# Patient Record
Sex: Female | Born: 1970
Health system: Southern US, Community
[De-identification: ages and names within clinical notes are randomized; demographics above are authoritative.]

## PROBLEM LIST (undated history)

## (undated) HISTORY — PX: CHOLECYSTECTOMY: SHX55

## (undated) HISTORY — PX: OTHER SURGICAL HISTORY: SHX169

---

## 2016-05-22 ENCOUNTER — Emergency Department (HOSPITAL_COMMUNITY): Admission: EM | Admit: 2016-05-22 | Discharge: 2016-05-22 | Discharge status: Against Medical Advice | Payer: Self-pay

## 2016-05-22 ENCOUNTER — Encounter (HOSPITAL_COMMUNITY): Payer: Self-pay | Admitting: Emergency Medicine

## 2016-05-22 ENCOUNTER — Encounter (HOSPITAL_COMMUNITY): Payer: Self-pay

## 2016-05-22 ENCOUNTER — Ambulatory Visit (HOSPITAL_COMMUNITY)
Admission: RE | Admit: 2016-05-22 | Discharge: 2016-05-22 | Disposition: A | Discharge status: Home / Self Care | Payer: BLUE CROSS/BLUE SHIELD | Source: Ambulatory Visit | Attending: Family Medicine | Admitting: Family Medicine

## 2016-05-22 ENCOUNTER — Inpatient Hospital Stay (HOSPITAL_COMMUNITY)
Admission: EM | Admit: 2016-05-22 | Discharge: 2016-05-31 | DRG: 373 | Disposition: A | Discharge status: Home Health | Payer: BLUE CROSS/BLUE SHIELD | Attending: Surgery | Admitting: Surgery

## 2016-05-22 ENCOUNTER — Ambulatory Visit (INDEPENDENT_AMBULATORY_CARE_PROVIDER_SITE_OTHER): Payer: BLUE CROSS/BLUE SHIELD | Admitting: Family Medicine

## 2016-05-22 ENCOUNTER — Encounter: Payer: Self-pay | Admitting: Family Medicine

## 2016-05-22 VITALS — BP 119/76 | HR 90 | Temp 98.1°F | Resp 16 | Wt 155.0 lb

## 2016-05-22 DIAGNOSIS — R935 Abnormal findings on diagnostic imaging of other abdominal regions, including retroperitoneum: Secondary | ICD-10-CM

## 2016-05-22 DIAGNOSIS — N739 Female pelvic inflammatory disease, unspecified: Secondary | ICD-10-CM

## 2016-05-22 DIAGNOSIS — K353 Acute appendicitis with localized peritonitis, without perforation or gangrene: Secondary | ICD-10-CM

## 2016-05-22 DIAGNOSIS — R1031 Right lower quadrant pain: Secondary | ICD-10-CM | POA: Insufficient documentation

## 2016-05-22 DIAGNOSIS — K3532 Acute appendicitis with perforation and localized peritonitis, without abscess: Secondary | ICD-10-CM

## 2016-05-22 DIAGNOSIS — K3533 Acute appendicitis with perforation and localized peritonitis, with abscess: Secondary | ICD-10-CM | POA: Diagnosis present

## 2016-05-22 LAB — COMPREHENSIVE METABOLIC PANEL
ALBUMIN: 3.1 g/dL — AB (ref 3.5–5.0)
ALK PHOS: 113 U/L (ref 38–126)
ALT: 20 U/L (ref 14–54)
ANION GAP: 9 (ref 5–15)
AST: 21 U/L (ref 15–41)
BUN: 6 mg/dL (ref 6–20)
CO2: 25 mmol/L (ref 22–32)
Calcium: 8.7 mg/dL — ABNORMAL LOW (ref 8.9–10.3)
Chloride: 99 mmol/L — ABNORMAL LOW (ref 101–111)
Creatinine, Ser: 0.61 mg/dL (ref 0.44–1.00)
GFR calc Af Amer: >60 mL/min (ref >60)
GFR calc non Af Amer: >60 mL/min (ref >60)
GLUCOSE: 101 mg/dL — AB (ref 65–99)
POTASSIUM: 3.8 mmol/L (ref 3.5–5.1)
SODIUM: 133 mmol/L — AB (ref 135–145)
Total Bilirubin: 0.9 mg/dL (ref 0.3–1.2)
Total Protein: 7.8 g/dL (ref 6.5–8.1)

## 2016-05-22 LAB — POC MICROSCOPIC URINALYSIS (UMFC)

## 2016-05-22 LAB — POCT URINALYSIS DIP (MANUAL ENTRY)
BILIRUBIN UA: NEGATIVE mg/dL
Bilirubin, UA: NEGATIVE
Glucose, UA: NEGATIVE mg/dL
NITRITE UA: NEGATIVE
PH UA: 6.5 (ref 5.0–8.0)
Spec Grav, UA: 1.015 (ref 1.010–1.025)
Urobilinogen, UA: 1 E.U./dL

## 2016-05-22 LAB — POCT CBC
GRANULOCYTE PERCENT: 82 % — AB (ref 37–80)
HEMATOCRIT: 31.4 % — AB (ref 37.7–47.9)
Hemoglobin: 10.5 g/dL — AB (ref 12.2–16.2)
Lymph, poc: 2.8 (ref 0.6–3.4)
MCH: 28.3 pg (ref 27–31.2)
MCHC: 33.5 g/dL (ref 31.8–35.4)
MCV: 84.3 fL (ref 80–97)
MID (cbc): 0.3 (ref 0–0.9)
MPV: 7 fL (ref 0–99.8)
POC GRANULOCYTE: 13.9 — AB (ref 2–6.9)
POC LYMPH %: 16.2 % (ref 10–50)
POC MID %: 1.8 % (ref 0–12)
Platelet Count, POC: 348 10*3/uL (ref 142–424)
RBC: 3.73 M/uL — AB (ref 4.04–5.48)
RDW, POC: 14 %
WBC: 17 10*3/uL — AB (ref 4.6–10.2)

## 2016-05-22 LAB — POCT URINE PREGNANCY: Preg Test, Ur: NEGATIVE

## 2016-05-22 MED ORDER — HYDROCODONE-ACETAMINOPHEN 5-325 MG PO TABS
1.0000 | ORAL_TABLET | ORAL | Status: DC | PRN
  Administered 2016-05-23 – 2016-05-25 (×2): 1 via ORAL
  Filled 2016-05-22 (×3): qty 1

## 2016-05-22 MED ORDER — KCL IN DEXTROSE-NACL 20-5-0.45 MEQ/L-%-% IV SOLN
INTRAVENOUS | Status: DC
  Administered 2016-05-22 – 2016-05-23 (×2): via INTRAVENOUS
  Administered 2016-05-23: 1000 mL via INTRAVENOUS
  Administered 2016-05-24 (×2): via INTRAVENOUS
  Filled 2016-05-22 (×5): qty 1000

## 2016-05-22 MED ORDER — PIPERACILLIN-TAZOBACTAM 3.375 G IVPB
3.3750 g | Freq: Three times a day (TID) | INTRAVENOUS | Status: DC
  Administered 2016-05-22 – 2016-05-29 (×20): 3.375 g via INTRAVENOUS
  Filled 2016-05-22 (×19): qty 50

## 2016-05-22 MED ORDER — MORPHINE SULFATE (PF) 4 MG/ML IV SOLN
1.0000 mg | INTRAVENOUS | Status: DC | PRN
  Administered 2016-05-23 (×2): 1 mg via INTRAVENOUS
  Administered 2016-05-30: 2 mg via INTRAVENOUS
  Filled 2016-05-22 (×3): qty 1

## 2016-05-22 MED ORDER — IOPAMIDOL (ISOVUE-300) INJECTION 61%
100.0000 mL | Freq: Once | INTRAVENOUS | Status: AC | PRN
  Administered 2016-05-22: 100 mL via INTRAVENOUS

## 2016-05-22 MED ORDER — ONDANSETRON 4 MG PO TBDP: 4.0000 mg | ORAL_TABLET | Freq: Four times a day (QID) | ORAL | Status: DC | PRN

## 2016-05-22 MED ORDER — ONDANSETRON HCL 4 MG/2ML IJ SOLN
4.0000 mg | Freq: Four times a day (QID) | INTRAMUSCULAR | Status: DC | PRN
  Administered 2016-05-30: 4 mg via INTRAVENOUS
  Filled 2016-05-22: qty 2

## 2016-05-22 MED ORDER — IOPAMIDOL (ISOVUE-300) INJECTION 61%
INTRAVENOUS | Status: AC
  Administered 2016-05-22: 30 mL via ORAL
  Filled 2016-05-22: qty 30

## 2016-05-22 NOTE — ED Notes (Signed)
Attempted to call 5 Glenda Wilcox with no answer.  Will try again.

## 2016-05-22 NOTE — Assessment & Plan Note (Signed)
Her exam is concerning for appendicitis. Doubtful for obstruction.

## 2016-05-22 NOTE — Patient Instructions (Addendum)
Thank you for coming in,   We will obtain a CT scan of your abdomen and I will call you with the results and what we do next.     Please feel free to call with any questions or concerns at any time, at [phone]. --Dr. Susa GriffinsSchmitz  Go [facility] now for ct  IF you received an x-ray today, you will receive an invoice from [facility]. Please contact [facility] at [phone] with questions or concerns regarding your invoice.   IF you received labwork today, you will receive an invoice from [facility]. Please contact [facility] at [phone] with questions or concerns regarding your invoice.   Our billing staff will not be able to assist you with questions regarding bills from these companies.  You will be contacted with the lab results as soon as they are available. The fastest way to get your results is to activate your My Chart account. Instructions are located on the last page of this paperwork. If you have not heard from us regarding the results in 2 weeks, please contact this office.

## 2016-05-22 NOTE — H&P (Signed)
Re:   Ernestina ColumbiaZeinab Surita DOB:   06/26/1970 MRN:   [id number]  Chief Complaint Abdominal pain  ASSESEMENT AND PLAN: 1.  Abscess, RLQ, probably secondary to ruptured appendicitis  Plan:  NPO, IVF, antibiotics, and per drain in AM  I explained all the findings and options with the patient and her friends.  No chief complaint on file.  PHYSICIAN REQUESTING CONSULTATION: Patient, No Pcp Per  HISTORY OF PRESENT ILLNESS: Ernestina ColumbiaZeinab Hopkins is a 46 y.o. (DOB: 11/13/1970)  AA female whose primary care physician is Patient, No Pcp Per. Her roommates, Nahid Haseid and Smithfield Foodsreeg Garassi, are with her.  I was called by Dr. Herma CarsonJ. Greene for Ms. Camila LiOsman who has had abdominal symptoms for 10+ days.  She [facility] to [facility] today for worsening abdominal pain.  She has had some diarrhea with these symptoms.  She has had some vomiting, maybe some fever, but she has not taken her temperature.  She has no chronic GI complaints and is no meds.  She had a myomectomy in 2004 in [place] (throught a lower abdominal Pfannenstiel incision), she had an "endoscopy" of her uterus in 2008, and "bile stones" removed in 2016 in [place]. Her gall bladder is absent on CT scan.  Her CT scan - 05/22/2016 - Marked inflammatory process in the right lower quadrant which I suspect is related to appendicitis with rupture and 9 x 6 x 6.5 cm abscess displacing adjacent terminal ileum and cecum (which are secondarily irritated). Inflammatory process immediately adjacent to right iliac vessels without significant compression or thrombosis.  WBC - 17,000 - 05/22/2016   No past medical history on file.   No past surgical history on file.    No current facility-administered medications for this encounter.    Current Outpatient Prescriptions  Medication Sig Dispense Refill  . ibuprofen (ADVIL,MOTRIN) 200 MG tablet Take 200 mg by mouth every 6 (six) hours as needed.       No Known Allergies  REVIEW OF SYSTEMS: Skin:  No history of rash.   No history of abnormal moles. Infection:  No history of hepatitis or HIV.  No history of MRSA. Neurologic:  No history of stroke.  No history of seizure.  No history of headaches. Cardiac:  No history of hypertension. No history of heart disease.   Pulmonary:  Does not smoke cigarettes.  No asthma or bronchitis.  No OSA/CPAP.  Endocrine:  No diabetes. No thyroid disease. Gastrointestinal:  See HPI. Urologic:  No history of kidney stones.  No history of bladder infections. Musculoskeletal:  No history of joint or back disease. Hematologic:  No bleeding disorder.  No history of anemia.  Not anticoagulated. Psycho-social:  The patient is oriented.   The patient has no obvious psychologic or social impairment to understanding our conversation and plan.  SOCIAL and FAMILY HISTORY: Married, but husband lives in [place]. She lives with "roommates" - Nahid Haseid and Engineer, building servicesAreeg Garassi. 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 She has no children.  PHYSICAL EXAM: There were no vitals taken for this visit.  General: Thin older looking AA F who is alert. Skin:  Inspection and palpation - no mass or rash. Eyes:  Conjunctiva and lids unremarkable.            Pupils are equal Ears, Nose, Mouth, and Throat:  Ears and nose unremarkable            Lips and teeth are unremarable. Neck: Supple. No  mass, trachea midline.  No thyroid mass. Lymph Nodes:  No supraclavicular, cervical, or inguinal nodes. Lungs: Normal respiratory effort.  Clear to auscultation and symmetric breath sounds. Heart:  Palpation of the heart is normal.            Auscultation: RRR. No murmur or rub.             Abdomen: Soft. No hernia.           Mass effect in RLQ.  Pfannenstiel incision, laparoscopic scars at umbilicus   Rectal: Not done. Musculoskeletal:              Good muscle strength and ROM  in upper and lower extremities. Neurologic:  Grossly intact to motor and sensory  function. Psychiatric: Normal judgement and insight. Behavior is normal.            Oriented to time, person, place.   DATA REVIEWED, COUNSELING AND COORDINATION OF CARE: Epic notes reviewed. Counseling and coordination of care exceeded more than 50% of the time spent with patient. Total time spent with patient and charting: 50 minutes.  Ovidio Kin, MD,  [facility] [address]  [address]   [place], [place]    [address] Phone:  [phone] FAX:  [phone]

## 2016-05-22 NOTE — ED Provider Notes (Signed)
[facility]-EMERGENCY DEPT Provider Note   CSN: [id number] Arrival date & time: 05/22/16  1911     History   Chief Complaint Chief Complaint  Patient presents with  . appendicitis    HPI Glenda Wilcox is a 46 y.o. female.  PT is a 46yo female who presents with a ten-day history of worsening pain to her right lower abdomen. She's had some associated fevers and nausea vomiting. She was seen by her PCP who ordered a CT scan of abdomen and pelvis which shows appendicitis with likely perforation and abscess collection. She was sent here for further evaluation.      History reviewed. No pertinent past medical history.  Patient Active Problem List   Diagnosis Date Noted  . Right lower quadrant abdominal pain 05/22/2016  . Abscess, appendix 05/22/2016    Past Surgical History:  Procedure Laterality Date  . gallstone removal      OB History    No data available       Home Medications    Prior to Admission medications   Medication Sig Start Date End Date Taking? Authorizing Provider  ibuprofen (ADVIL,MOTRIN) 200 MG tablet Take 200 mg by mouth every 6 (six) hours as needed.   Yes [provider]    Family History No family history on file.  Social History Social History  Substance Use Topics  . Smoking status: Never Smoker  . Smokeless tobacco: Never Used  . Alcohol use No     Allergies   Patient has no known allergies.   Review of Systems Review of Systems  Constitutional: Positive for fever. Negative for chills, diaphoresis and fatigue.  HENT: Negative for congestion, rhinorrhea and sneezing.   Eyes: Negative.   Respiratory: Negative for cough, chest tightness and shortness of breath.   Cardiovascular: Negative for chest pain and leg swelling.  Gastrointestinal: Positive for abdominal pain, nausea and vomiting. Negative for blood in stool and diarrhea.  Genitourinary: Negative for difficulty urinating, flank pain, frequency and hematuria.    Musculoskeletal: Negative for arthralgias and back pain.  Skin: Negative for rash.  Neurological: Negative for dizziness, speech difficulty, weakness, numbness and headaches.     Physical Exam Updated Vital Signs BP 128/77 (BP Location: Left Arm)   Pulse 96   Temp 99.8 F (37.7 C) (Oral)   Resp 16   SpO2 96%   Physical Exam  Constitutional: She is oriented to person, place, and time. She appears well-developed and well-nourished.  HENT:  Head: Normocephalic and atraumatic.  Eyes: Pupils are equal, round, and reactive to light.  Neck: Normal range of motion. Neck supple.  Cardiovascular: Normal rate, regular rhythm and normal heart sounds.   Pulmonary/Chest: Effort normal and breath sounds normal. No respiratory distress. She has no wheezes. She has no rales. She exhibits no tenderness.  Abdominal: Soft. Bowel sounds are normal. There is tenderness (Moderate tenderness to right lower abdomen). There is no rebound and no guarding.  Musculoskeletal: Normal range of motion. She exhibits no edema.  Lymphadenopathy:    She has no cervical adenopathy.  Neurological: She is alert and oriented to person, place, and time.  Skin: Skin is warm and dry. No rash noted.  Psychiatric: She has a normal mood and affect.     ED Treatments / Results  Labs (all labs ordered are listed, but only abnormal results are displayed) Labs Reviewed  HIV ANTIBODY (ROUTINE TESTING)  COMPREHENSIVE METABOLIC PANEL    EKG  EKG Interpretation None  Radiology Ct Abdomen Pelvis W Contrast  Result Date: 05/22/2016 CLINICAL DATA:  46 year old female with right lower quadrant pain, nausea, vomiting and diarrhea for 3 weeks. Prior gallbladder surgery. Initial encounter. EXAM: CT ABDOMEN AND PELVIS WITH CONTRAST TECHNIQUE: Multidetector CT imaging of the abdomen and pelvis was performed using the standard protocol following bolus administration of intravenous contrast. CONTRAST:  ISOVUE-300  IOPAMIDOL (ISOVUE-300) INJECTION 61% COMPARISON:  None. FINDINGS: Lower chest: Minimal atelectasis. Heart size within normal limits. Prominent dense breast parenchyma with coarse calcifications. Hepatobiliary: No worrisome hepatic lesion. Very mild fatty infiltration. Minimally lobular contour without other findings of cirrhosis. Post cholecystectomy. Pancreas: No mass or inflammation. Spleen: No mass or enlargement. Adrenals/Urinary Tract: No renal or ureteral obstructing stone or hydronephrosis. Tiny low-density structure right kidney may represent a tiny angiomyelolipoma. No adrenal mass. Decompressed urinary bladder without gross abnormality. Stomach/Bowel: Marked inflammatory process in the right lower quadrant which I suspect is related to appendicitis with rupture and 9 x 6 x 6.5 cm abscess displacing adjacent terminal ileum and cecum (which are secondarily irritated). Other causes such as Meckel's diverticulum or tumor felt to be much less likely. Vascular/Lymphatic: No abdominal aortic aneurysm or large vessel occlusion. Inflammatory process immediately adjacent to right iliac vessels without significant compression or thrombosis. Lymph nodes within small bowel mesentery probably reactive in origin. Reproductive: No worrisome primary adnexal or uterine abnormality. Other: Gas within abscess but without surrounding free intraperitoneal air otherwise noted. Musculoskeletal: No worrisome osseous abnormality. IMPRESSION: Marked inflammatory process in the right lower quadrant which I suspect is related to appendicitis with rupture and 9 x 6 x 6.5 cm abscess displacing adjacent terminal ileum and cecum (which are secondarily irritated). Inflammatory process immediately adjacent to right iliac vessels without significant compression or thrombosis. These results were called by telephone at the time of interpretation on 05/22/2016 at 6:36 pm to Dr. Meredith Staggers , who verbally acknowledged these results.  Electronically Signed   By: Lacy Duverney M.D.   On: 05/22/2016 18:59    Procedures Procedures (including critical care time)  Medications Ordered in ED Medications  dextrose 5 % and 0.45 % NaCl with KCl 20 mEq/L infusion (not administered)  piperacillin-tazobactam (ZOSYN) IVPB 3.375 g (not administered)  HYDROcodone-acetaminophen (NORCO/VICODIN) 5-325 MG per tablet 1-2 tablet (not administered)  morphine 4 MG/ML injection 1-3 mg (not administered)  ondansetron (ZOFRAN-ODT) disintegrating tablet 4 mg (not administered)    Or  ondansetron (ZOFRAN) injection 4 mg (not administered)     Initial Impression / Assessment and Plan / ED Course  I have reviewed the triage vital signs and the nursing notes.  Pertinent labs & imaging results that were available during my care of the patient were reviewed by me and considered in my medical decision making (see chart for details).     Patient seen by Dr. Ezzard Standing who also started her on antibiotics. She will be admitted to the surgery service for appendectomy.  Final Clinical Impressions(s) / ED Diagnoses   Final diagnoses:  Acute appendicitis with localized peritonitis    New Prescriptions New Prescriptions   No medications on file     Rolan Bucco, MD 05/22/16 2011

## 2016-05-22 NOTE — ED Notes (Addendum)
Pt was sent here from her PCP for imaging of her abdomen for nausea, vomiting, and diarrhea x 10 days. Pt states symptoms got worse 2 days ago. Imaging showed appendicitis for which pt was sent back to ED. Surgeon is at bedside

## 2016-05-22 NOTE — ED Notes (Signed)
PAIN  IS IN RIGHT SIDE

## 2016-05-22 NOTE — Progress Notes (Signed)
7:04 PM   Patient discussed with Dr. Jordan LikesSchmitz this morning.  Agree with findings, assessment and plan of care per his note.    Call received from radiology - suspected ruptured appendicitis with abscess. Results d/w patient on phone and advised CT tech to walk her over to ER ([facility]).  Charge nurse advised at [facility] and advised surgeon [facility] - discussed with Dr. Ezzard StandingNewman.

## 2016-05-22 NOTE — ED Notes (Signed)
Urine in triage.

## 2016-05-22 NOTE — Progress Notes (Signed)
  Glenda ColumbiaZeinab Wilcox - 46 y.o. female MRN [id number]  Date of birth: 03/27/1970  SUBJECTIVE:  Including CC & ROS.  Chief Complaint  Patient presents with  . Abdominal Pain    x 10 days nausea had diarrhea and vomiting    Glenda Wilcox is presenting with RLQ abdominal pain. The pain has been present for 10 days. She has felt warm. Initially had pain with vomiting. She also had diarrhea with the first day of symptoms. The vomiting stopped after the first day. Doesn't remember eating different before her symptoms started. Pain was initially intermittent but has become more constant. There was no blood in the emesis or diarrhea. Has had diarrhea off and on since the symptoms started. She has nausea with anything she eats. No new medicines and no new travel. No chance of being pregnant. Reports last time having sexual intercourse was 4-5 months ago. No new rashes.    ROS: No unexpected weight loss, fever, chills, swelling, instability, muscle pain, numbness/tingling, redness, otherwise see HPI   HISTORY: Past Medical, Surgical, Social, and Family History Reviewed & Updated per EMR.   Pertinent Historical Findings include: PMHx: choledocholithiasis Surgical:  ERCP   Social:  No tobacco or alcohol use  FHx: dm2, HTN  DATA REVIEWED: none  PHYSICAL EXAM:  VS: BP 119/76 (Cuff Size: Normal)   Pulse 90   Temp 98.1 F (36.7 C) (Oral)   Resp 16   Wt 155 lb (70.3 kg)   SpO2 100%  PHYSICAL EXAM: Gen: NAD, alert, cooperative with exam,  HEENT: NCAT, EOMI, clear conjunctiva,  CV: RRR, good S1/S2, no murmur, no edema, capillary refill brisk  Resp: CTABL, no wheezes, non-labored Abd: TTP in RLQ, some guarding, +BS, positive McBurney's point,  Skin: no rashes, normal turgor  Neuro: no gross deficits.  Psych: alert and oriented   ASSESSMENT & PLAN:   No problem-specific Assessment & Plan notes found for this encounter.

## 2016-05-22 NOTE — ED Notes (Signed)
Bed: [id number] Expected date:  Expected time:  Means of arrival:  Comments: Triage, ruptured appendix, OSMOND

## 2016-05-23 ENCOUNTER — Inpatient Hospital Stay (HOSPITAL_COMMUNITY): Payer: BLUE CROSS/BLUE SHIELD

## 2016-05-23 LAB — CBC
HCT: 31.4 % — ABNORMAL LOW (ref 36.0–46.0)
Hemoglobin: 10.3 g/dL — ABNORMAL LOW (ref 12.0–15.0)
MCH: 28.3 pg (ref 26.0–34.0)
MCHC: 32.8 g/dL (ref 30.0–36.0)
MCV: 86.3 fL (ref 78.0–100.0)
PLATELETS: 349 10*3/uL (ref 150–400)
RBC: 3.64 MIL/uL — ABNORMAL LOW (ref 3.87–5.11)
RDW: 13.8 % (ref 11.5–15.5)
WBC: 15.8 10*3/uL — AB (ref 4.0–10.5)

## 2016-05-23 LAB — COMPREHENSIVE METABOLIC PANEL
ALT: 18 U/L (ref 14–54)
AST: 18 U/L (ref 15–41)
Albumin: 2.9 g/dL — ABNORMAL LOW (ref 3.5–5.0)
Alkaline Phosphatase: 109 U/L (ref 38–126)
Anion gap: 10 (ref 5–15)
BUN: 6 mg/dL (ref 6–20)
CALCIUM: 8.4 mg/dL — AB (ref 8.9–10.3)
CHLORIDE: 99 mmol/L — AB (ref 101–111)
CO2: 25 mmol/L (ref 22–32)
CREATININE: 0.64 mg/dL (ref 0.44–1.00)
Glucose, Bld: 120 mg/dL — ABNORMAL HIGH (ref 65–99)
POTASSIUM: 3.7 mmol/L (ref 3.5–5.1)
SODIUM: 134 mmol/L — AB (ref 135–145)
TOTAL PROTEIN: 7.5 g/dL (ref 6.5–8.1)
Total Bilirubin: 1.2 mg/dL (ref 0.3–1.2)

## 2016-05-23 LAB — HIV ANTIBODY (ROUTINE TESTING W REFLEX): HIV SCREEN 4TH GENERATION: NONREACTIVE

## 2016-05-23 LAB — PROTIME-INR
INR: 1.31
PROTHROMBIN TIME: 16.4 s — AB (ref 11.4–15.2)

## 2016-05-23 MED ORDER — FENTANYL CITRATE (PF) 100 MCG/2ML IJ SOLN
INTRAMUSCULAR | Status: AC | PRN
  Administered 2016-05-23 (×2): 25 ug via INTRAVENOUS

## 2016-05-23 MED ORDER — MIDAZOLAM HCL 2 MG/2ML IJ SOLN
INTRAMUSCULAR | Status: AC
  Filled 2016-05-23: qty 6

## 2016-05-23 MED ORDER — MIDAZOLAM HCL 2 MG/2ML IJ SOLN
INTRAMUSCULAR | Status: AC | PRN
  Administered 2016-05-23 (×3): 1 mg via INTRAVENOUS

## 2016-05-23 MED ORDER — LIDOCAINE HCL (PF) 1 % IJ SOLN
INTRAMUSCULAR | Status: AC | PRN
  Administered 2016-05-23: 10 mL via INTRADERMAL

## 2016-05-23 MED ORDER — FENTANYL CITRATE (PF) 100 MCG/2ML IJ SOLN
INTRAMUSCULAR | Status: AC
  Filled 2016-05-23: qty 4

## 2016-05-23 MED ORDER — ACETAMINOPHEN 325 MG PO TABS
650.0000 mg | ORAL_TABLET | Freq: Four times a day (QID) | ORAL | Status: DC | PRN
  Administered 2016-05-23 – 2016-05-25 (×3): 650 mg via ORAL
  Filled 2016-05-23 (×3): qty 2

## 2016-05-23 NOTE — Progress Notes (Signed)
Got a phone order from Dr. Johna SheriffHoxworth to change diet to NPO with sips of meds and Tylenol 650 q6 PRN with Temperature > 101 .

## 2016-05-23 NOTE — Consult Note (Signed)
Chief Complaint: Patient was seen in consultation today for CT-guided drainage of right lower quadrant/pelvic abscess Chief Complaint  Patient presents with  . appendicitis    Referring Physician(s): Newman,D  Supervising Physician: Oley BalmHassell, Daniel  [facility]  History of Present Illness: Glenda Wilcox is a 46 y.o. female recently admitted with history of right lower quadrant abdominal pain, diarrhea, nausea, vomiting and low-grade fever. Laboratory studies showed leukocytosis and CT abdomen/ pelvis revealed:   Marked inflammatory process in the right lower quadrant which   is likely related to appendicitis with rupture and 9 x 6 x 6.5 cm abscess displacing adjacent terminal ileum and cecum (which are secondarily irritated). Inflammatory process immediately adjacent to right iliac vessels without significant compression or thrombosis.  Request now received from surgical service for CT-guided drainage of the right lower quadrant abd/pelvic abscess.  History reviewed. No pertinent past medical history.  Past Surgical History:  Procedure Laterality Date  . gallstone removal      Allergies: Patient has no known allergies.  Medications: Prior to Admission medications   Medication Sig Start Date End Date Taking? Authorizing Provider  ibuprofen (ADVIL,MOTRIN) 200 MG tablet Take 200 mg by mouth every 6 (six) hours as needed.   Yes [provider]     No family history on file.  Social History   Social History  . Marital status: Married    Spouse name: N/A  . Number of children: N/A  . Years of education: N/A   Social History Main Topics  . Smoking status: Never Smoker  . Smokeless tobacco: Never Used  . Alcohol use No  . Drug use: Unknown  . Sexual activity: Not Asked   Other Topics Concern  . None   Social History Narrative  . None      Review of Systems see above; currently denies headache, chest pain, dyspnea, cough, back  pain, or abnormal bleeding.  Vital Signs: BP (!) 107/58 (BP Location: Left Arm)   Pulse 95   Temp 100.3 F (37.9 C) (Oral)   Resp 16   Ht 5\' 4"  (1.626 m)   Wt 148 lb 9.4 oz (67.4 kg)   SpO2 100%   BMI 25.51 kg/m   Physical Exam awake, alert. Chest clear to auscultation bilaterally. Heart with regular rate and rhythm. Abdomen soft, positive bowel sounds, mod tender right lower quadrant to palpation; lower extremities with no edema.  Mallampati Score:     Imaging: Ct Abdomen Pelvis W Contrast  Result Date: 05/22/2016 CLINICAL DATA:  46 year old female with right lower quadrant pain, nausea, vomiting and diarrhea for 3 weeks. Prior gallbladder surgery. Initial encounter. EXAM: CT ABDOMEN AND PELVIS WITH CONTRAST TECHNIQUE: Multidetector CT imaging of the abdomen and pelvis was performed using the standard protocol following bolus administration of intravenous contrast. CONTRAST:  100mL ISOVUE-300 IOPAMIDOL (ISOVUE-300) INJECTION 61% COMPARISON:  None. FINDINGS: Lower chest: Minimal atelectasis. Heart size within normal limits. Prominent dense breast parenchyma with coarse calcifications. Hepatobiliary: No worrisome hepatic lesion. Very mild fatty infiltration. Minimally lobular contour without other findings of cirrhosis. Post cholecystectomy. Pancreas: No mass or inflammation. Spleen: No mass or enlargement. Adrenals/Urinary Tract: No renal or ureteral obstructing stone or hydronephrosis. Tiny low-density structure right kidney may represent a tiny angiomyelolipoma. No adrenal mass. Decompressed urinary bladder without gross abnormality. Stomach/Bowel: Marked inflammatory process in the right lower quadrant which I suspect is related to appendicitis with rupture and 9 x 6 x 6.5 cm abscess displacing adjacent terminal ileum  and cecum (which are secondarily irritated). Other causes such as Meckel's diverticulum or tumor felt to be much less likely. Vascular/Lymphatic: No abdominal aortic aneurysm  or large vessel occlusion. Inflammatory process immediately adjacent to right iliac vessels without significant compression or thrombosis. Lymph nodes within small bowel mesentery probably reactive in origin. Reproductive: No worrisome primary adnexal or uterine abnormality. Other: Gas within abscess but without surrounding free intraperitoneal air otherwise noted. Musculoskeletal: No worrisome osseous abnormality. IMPRESSION: Marked inflammatory process in the right lower quadrant which I suspect is related to appendicitis with rupture and 9 x 6 x 6.5 cm abscess displacing adjacent terminal ileum and cecum (which are secondarily irritated). Inflammatory process immediately adjacent to right iliac vessels without significant compression or thrombosis. These results were called by telephone at the time of interpretation on 05/22/2016 at 6:36 pm to Dr. Meredith Staggers , who verbally acknowledged these results. Electronically Signed   By: Lacy Duverney M.D.   On: 05/22/2016 18:59    Labs:  CBC:  Recent Labs  05/22/16 1139 05/23/16 0449  WBC 17.0* 15.8*  HGB 10.5* 10.3*  HCT 31.4* 31.4*  PLT  --  349    COAGS: No results for input(s): INR, APTT in the last 8760 hours.  BMP:  Recent Labs  05/22/16 1956 05/23/16 0449  NA 133* 134*  K 3.8 3.7  CL 99* 99*  CO2 25 25  GLUCOSE 101* 120*  BUN 6 6  CALCIUM 8.7* 8.4*  CREATININE 0.61 0.64  GFRNONAA >60 >60  GFRAA >60 >60    LIVER FUNCTION TESTS:  Recent Labs  05/22/16 1956 05/23/16 0449  BILITOT 0.9 1.2  AST 21 18  ALT 20 18  ALKPHOS 113 109  PROT 7.8 7.5  ALBUMIN 3.1* 2.9*    TUMOR MARKERS: No results for input(s): AFPTM, CEA, CA199, CHROMGRNA in the last 8760 hours.  Assessment and Plan: Patient with history of right lower quadrant abdominal pain, nausea, vomiting, diarrhea, low-grade fever , leukocytosis and CT revealing appendiceal abscess. Request now received for CT guided drainage of the abscess. Imaging studies have  been reviewed by Dr. Deanne Coffer and the area appears amenable to drainage.Risks and benefits discussed with the patient including bleeding, infection, damage to adjacent structures, bowel perforation/fistula connection, and sepsis.All of the patient's questions were answered, patient is agreeable to proceed.Consent signed and in chart. Procedure scheduled for later today.     Thank you for this interesting consult.  I greatly enjoyed meeting Glenda Wilcox and look forward to participating in their care.  A copy of this report was sent to the requesting provider on this date.  Electronically Signed: D. Lysle Morales 05/23/2016, 10:13 AM   I spent a total of 25 minutes in face to face in clinical consultation, greater than 50% of which was counseling/coordinating care for CT-guided drainage of right lower quadrant abdominal/pelvic abscess

## 2016-05-23 NOTE — Progress Notes (Signed)
Pt showing signs of sepsis with a temperature of 102.2, HR of 102 and respirations of 22. Called rapid response. Rapid response nurse assessed patient and determined patient was not septic, but a response to the drain being placed. Will continue to monitor and will call rapid response if needed.

## 2016-05-23 NOTE — Significant Event (Signed)
Rapid Response Event Note  Overview: Called to room 1528 for observation of patient looking septic.       Initial Focused Assessment: Focused on VS and labs.   Interventions: Cont to observe Plan of Care (if not transferred):Continue to monitor, call RRT RN if needed further.  Event Summary: Upon arrival patient supine in the bed. VS 133/80(95), HR 100, NSR/ST, RR 18, Sats 100% on [place] 2 L, last temp 102.2. Respirations regular and unlabored, auscultated breath sounds were clear, abdomen tender to touch with hypo bowel sounds, drain on right abdomen with purulent drainage. Will hand off to night RRT RN and cont to observe. Possibly needs lactic acid drawn. Reassured bedside RN to call RRT RN if needed further.   at      at          [place]

## 2016-05-23 NOTE — Progress Notes (Signed)
Patient ID: Glenda Wilcox, female   DOB: 03/04/1970, 46 y.o.   MRN: [id number]  [facility] Surgery Progress Note     Subjective: CC- abdominal abscess, abdominal pain Patient states that she feels slightly better this morning. Pain medication is helping. She does still complain of RLQ pain. Denies any current n/v. BM this AM.  Objective: Vital signs in last 24 hours: Temp:  [98.1 F (36.7 C)-101.3 F (38.5 C)] 100.3 F (37.9 C) (05/15 0447) Pulse Rate:  [87-96] 95 (05/15 0447) Resp:  [16-17] 16 (05/15 0447) BP: (107-129)/(58-83) 107/58 (05/15 0447) SpO2:  [96 %-100 %] 100 % (05/15 0447) Weight:  [148 lb (67.1 kg)-155 lb (70.3 kg)] 148 lb 9.4 oz (67.4 kg) (05/14 2114) Last BM Date: 05/21/16  Intake/Output from previous day: 05/14 0701 - 05/15 0700 In: 1475 [I.V.:1375; IV Piggyback:100] Out: 700 [Urine:700] Intake/Output this shift: No intake/output data recorded.  PE: Gen:  Alert, NAD, pleasant Card:  RRR, no M/G/R heard Pulm:  CTAB, no W/R/R, effort normal Abd: Soft, ND, moderate RLQ tendernes, +BS, no HSM, no hernia Ext:  No erythema, edema, or tenderness BUE/BLE   Lab Results:   Recent Labs  05/22/16 1139 05/23/16 0449  WBC 17.0* 15.8*  HGB 10.5* 10.3*  HCT 31.4* 31.4*  PLT  --  349   BMET  Recent Labs  05/22/16 1956 05/23/16 0449  NA 133* 134*  K 3.8 3.7  CL 99* 99*  CO2 25 25  GLUCOSE 101* 120*  BUN 6 6  CREATININE 0.61 0.64  CALCIUM 8.7* 8.4*   PT/INR No results for input(s): LABPROT, INR in the last 72 hours. CMP     Component Value Date/Time   NA 134 (L) 05/23/2016 0449   K 3.7 05/23/2016 0449   CL 99 (L) 05/23/2016 0449   CO2 25 05/23/2016 0449   GLUCOSE 120 (H) 05/23/2016 0449   BUN 6 05/23/2016 0449   CREATININE 0.64 05/23/2016 0449   CALCIUM 8.4 (L) 05/23/2016 0449   PROT 7.5 05/23/2016 0449   ALBUMIN 2.9 (L) 05/23/2016 0449   AST 18 05/23/2016 0449   ALT 18 05/23/2016 0449   ALKPHOS 109 05/23/2016 0449   BILITOT 1.2  05/23/2016 0449   GFRNONAA >60 05/23/2016 0449   GFRAA >60 05/23/2016 0449   Lipase  No results found for: LIPASE     Studies/Results: Ct Abdomen Pelvis W Contrast  Result Date: 05/22/2016 CLINICAL DATA:  46 year old female with right lower quadrant pain, nausea, vomiting and diarrhea for 3 weeks. Prior gallbladder surgery. Initial encounter. EXAM: CT ABDOMEN AND PELVIS WITH CONTRAST TECHNIQUE: Multidetector CT imaging of the abdomen and pelvis was performed using the standard protocol following bolus administration of intravenous contrast. CONTRAST:  ISOVUE-300 IOPAMIDOL (ISOVUE-300) INJECTION 61% COMPARISON:  None. FINDINGS: Lower chest: Minimal atelectasis. Heart size within normal limits. Prominent dense breast parenchyma with coarse calcifications. Hepatobiliary: No worrisome hepatic lesion. Very mild fatty infiltration. Minimally lobular contour without other findings of cirrhosis. Post cholecystectomy. Pancreas: No mass or inflammation. Spleen: No mass or enlargement. Adrenals/Urinary Tract: No renal or ureteral obstructing stone or hydronephrosis. Tiny low-density structure right kidney may represent a tiny angiomyelolipoma. No adrenal mass. Decompressed urinary bladder without gross abnormality. Stomach/Bowel: Marked inflammatory process in the right lower quadrant which I suspect is related to appendicitis with rupture and 9 x 6 x 6.5 cm abscess displacing adjacent terminal ileum and cecum (which are secondarily irritated). Other causes such as Meckel's diverticulum or tumor felt to be much less likely.  Vascular/Lymphatic: No abdominal aortic aneurysm or large vessel occlusion. Inflammatory process immediately adjacent to right iliac vessels without significant compression or thrombosis. Lymph nodes within small bowel mesentery probably reactive in origin. Reproductive: No worrisome primary adnexal or uterine abnormality. Other: Gas within abscess but without surrounding free  intraperitoneal air otherwise noted. Musculoskeletal: No worrisome osseous abnormality. IMPRESSION: Marked inflammatory process in the right lower quadrant which I suspect is related to appendicitis with rupture and 9 x 6 x 6.5 cm abscess displacing adjacent terminal ileum and cecum (which are secondarily irritated). Inflammatory process immediately adjacent to right iliac vessels without significant compression or thrombosis. These results were called by telephone at the time of interpretation on 05/22/2016 at 6:36 pm to Dr. Meredith StaggersJEFFREY GREENE , who verbally acknowledged these results. Electronically Signed   By: Lacy DuverneySteven  Olson M.D.   On: 05/22/2016 18:59    Anti-infectives: Anti-infectives    Start     Dose/Rate Route Frequency Ordered Stop   05/22/16 2000  piperacillin-tazobactam (ZOSYN) IVPB 3.375 g     3.375 g 12.5 mL/hr over 240 Minutes Intravenous Every 8 hours 05/22/16 1949         Assessment/Plan Abscess, RLQ, probably secondary to ruptured appendicitis - CT scan shows marked inflammatory process in the right lower quadrant likely related to appendicitis with rupture and 9 x 6 x 6.5 cm abscess  - WBC slightly down today 15.8 from 17  ID - zosyn 5/14>> FEN - IVF, NPO VTE - SCDs, hold chemical DVT prophylaxis for possible procedure  Plan - IR consult pending for possible percutaneous drainage of abscess (spoke with Caryn BeeKevin PA and Dr. Deanne CofferHassell is reviewing case). Continue IV antibiotics. NPO for possibly procedure; ok for ice chips/sips of water after procedure.   LOS: 1 day    Edson SnowballBROOKE A MILLER , [facility] 05/23/2016, 9:15 AM Pager: [phone] Consults: [phone] Mon-Fri 7:00 am-4:30 pm Sat-Sun 7:00 am-11:30 am

## 2016-05-23 NOTE — Procedures (Signed)
CT RLQ abscess drain 26F 30ml purulent aspirate sent for GS &cx No complication No blood loss. See complete dictation in [facility].

## 2016-05-24 ENCOUNTER — Encounter (HOSPITAL_COMMUNITY): Payer: Self-pay | Admitting: Surgery

## 2016-05-24 LAB — BASIC METABOLIC PANEL
Anion gap: 8 (ref 5–15)
CO2: 24 mmol/L (ref 22–32)
Calcium: 8.3 mg/dL — ABNORMAL LOW (ref 8.9–10.3)
Chloride: 100 mmol/L — ABNORMAL LOW (ref 101–111)
Creatinine, Ser: 0.8 mg/dL (ref 0.44–1.00)
GFR calc Af Amer: >60 mL/min (ref >60)
GLUCOSE: 160 mg/dL — AB (ref 65–99)
POTASSIUM: 4.1 mmol/L (ref 3.5–5.1)
Sodium: 132 mmol/L — ABNORMAL LOW (ref 135–145)

## 2016-05-24 LAB — CBC
HEMATOCRIT: 30.7 % — AB (ref 36.0–46.0)
Hemoglobin: 10 g/dL — ABNORMAL LOW (ref 12.0–15.0)
MCH: 28.3 pg (ref 26.0–34.0)
MCHC: 32.6 g/dL (ref 30.0–36.0)
MCV: 87 fL (ref 78.0–100.0)
PLATELETS: 331 10*3/uL (ref 150–400)
RBC: 3.53 MIL/uL — AB (ref 3.87–5.11)
RDW: 14 % (ref 11.5–15.5)
WBC: 19.2 10*3/uL — ABNORMAL HIGH (ref 4.0–10.5)

## 2016-05-24 MED ORDER — POTASSIUM CHLORIDE IN NACL 20-0.9 MEQ/L-% IV SOLN
INTRAVENOUS | Status: DC
  Administered 2016-05-24: 13:00:00 via INTRAVENOUS
  Administered 2016-05-25: 125 mL/h via INTRAVENOUS
  Administered 2016-05-25: 1000 mL via INTRAVENOUS
  Administered 2016-05-25: 06:00:00 via INTRAVENOUS
  Administered 2016-05-26: 125 mL/h via INTRAVENOUS
  Administered 2016-05-27 – 2016-05-28 (×4): via INTRAVENOUS
  Filled 2016-05-24 (×13): qty 1000

## 2016-05-24 MED ORDER — ENOXAPARIN SODIUM 40 MG/0.4ML ~~LOC~~ SOLN
40.0000 mg | SUBCUTANEOUS | Status: DC
  Administered 2016-05-24 – 2016-05-29 (×6): 40 mg via SUBCUTANEOUS
  Filled 2016-05-24 (×6): qty 0.4

## 2016-05-24 NOTE — Progress Notes (Signed)
    CC:  abdominal pain  Subjective: She says her pain is better, but when you palpate her abdomen she is still extremely tender. No bowel sounds. The drainage bag is empty. But it has purulent-looking fluid in it that has been emptied. What is in the tube currently is more clear looking. I don't know if this is flush or actual drainage from the abscess. She reports having a lot of diarrhea/liquid stool last evening, and this morning.  Objective: Vital signs in last 24 hours: Temp:  [98.9 F (37.2 C)-103.2 F (39.6 C)] 98.9 F (37.2 C) (05/16 0545) Pulse Rate:  [89-109] 89 (05/16 0545) Resp:  [13-28] 18 (05/16 0545) BP: (100-137)/(62-81) 100/66 (05/16 0545) SpO2:  [99 %-100 %] 99 % (05/16 0545) Last BM Date: 05/23/16 1555 IV NPO 300 urine recorded Drain 150  MAXIMUM TEMPERATURE 102 last p.m. at 1900, down to 98.9 this a.m. Vital signs are stable. WBC up to 19.2, H/H stable NA 132, glucose 160  Intake/Output from previous day: 05/15 0701 - 05/16 0700 In: 1555 [I.V.:1500; IV Piggyback:50] Out: 450 [Urine:300; Drains:150] Intake/Output this shift: Total I/O In: 0  Out: 20 [Drains:20]  General appearance: alert, cooperative and no distress Resp: clear to auscultation bilaterally GI: soft, very tender, no bowel sounds, having diarrhea.  Lab Results:   Recent Labs  05/23/16 0449 05/24/16 0446  WBC 15.8* 19.2*  HGB 10.3* 10.0*  HCT 31.4* 30.7*  PLT 349 331    BMET  Recent Labs  05/23/16 0449 05/24/16 0446  NA 134* 132*  K 3.7 4.1  CL 99* 100*  CO2 25 24  GLUCOSE 120* 160*  BUN 6 <5*  CREATININE 0.64 0.80  CALCIUM 8.4* 8.3*   PT/INR  Recent Labs  05/23/16 1049  LABPROT 16.4*  INR 1.31     Recent Labs Lab 05/22/16 1956 05/23/16 0449  AST 21 18  ALT 20 18  ALKPHOS 113 109  BILITOT 0.9 1.2  PROT 7.8 7.5  ALBUMIN 3.1* 2.9*     Lipase  No results found for: LIPASE   Medications:   . dextrose 5 % and 0.45 % NaCl with KCl 20 mEq/L 125  mL/hr at 05/24/16 0124  . piperacillin-tazobactam (ZOSYN)  IV Stopped (05/24/16 [id number])   Assessment/Plan Abscess, RLQ, probably secondary to ruptured appendicitis S/p IR drain placement 05/23/16 Dr. Raelene Bottaniel Hassel ID:  Zosyn 5/14=>> day 3 FEN: IV fluids/nothing by mouth ID:  SCDs/ add Lovenox today.    Plan: Continue IV antibiotics and IV fluids. I'm going to give her ice chips but not much more at this point.   LOS: 2 days    Shivaan Tierno 05/24/2016 [phone]

## 2016-05-24 NOTE — Progress Notes (Signed)
Initial Nutrition Assessment  INTERVENTION:   Diet advancement per MD Upon diet advancement, will address nutrition supplement needs  NUTRITION DIAGNOSIS:   Inadequate oral intake related to inability to eat as evidenced by NPO status.  GOAL:   Patient will meet greater than or equal to 90% of their needs  MONITOR:   Diet advancement, Labs, Weight trends, I & O's  REASON FOR ASSESSMENT:   Malnutrition Screening Tool    ASSESSMENT:   46yo female who presents with a ten-day history of worsening pain to her right lower abdomen. She's had some associated fevers and nausea vomiting. She was seen by her PCP who ordered a CT scan of abdomen and pelvis which shows appendicitis with likely perforation and abscess collection  Patient has had N/V and abdominal pain for over a week PTA d/t appendix abscess. Pt is s/p percutaneous drainage of abscess 5/15. Pt then began to show signs of sepsis per nursing notes. Pt is now c/o diarrhea. Diet continues to be NPO. Pt would most likely benefit from nutritional supplementation given poor PO > 7 days now. Will discuss options with pt once diet is advanced.  Per chart review, not weight loss noted. Will perform NFPE at follow-up.  Medications reviewed. Labs reviewed: Low Na  Diet Order:  Diet NPO time specified Except for: Ice Chips, Sips with Meds  Skin:  Reviewed, no issues  Last BM:  5/16  Height:   Ht Readings from Last 1 Encounters:  05/22/16 5\' 4"  (1.626 m)    Weight:   Wt Readings from Last 1 Encounters:  05/22/16 148 lb 9.4 oz (67.4 kg)    Ideal Body Weight:  54.5 kg  BMI:  Body mass index is 25.51 kg/m.  Estimated Nutritional Needs:   Kcal:  1650-1850  Protein:  75-85g  Fluid:  1.8L/day  EDUCATION NEEDS:   No education needs identified at this time  Tilda FrancoLindsey Arieonna Medine, MS, RD, LDN Pager: [phone] After Hours Pager: [phone]

## 2016-05-24 NOTE — Progress Notes (Signed)
Referring Physician(s): Newman,D  Supervising Physician: Ruel Favors  Patient Status:  [facility] - In-pt  Chief Complaint:  Appendiceal abscess  Subjective: Pt doing ok this am; has some mild abd discomfort, some persistent watery diarrhea; spiked temp to 103 last night following drain placement, currently AF; denies N/V/CP/dyspnea   Allergies: Patient has no known allergies.  Medications: Prior to Admission medications   Medication Sig Start Date End Date Taking? Authorizing Provider  ibuprofen (ADVIL,MOTRIN) 200 MG tablet Take 200 mg by mouth every 6 (six) hours as needed.   Yes [provider]     Vital Signs: BP 100/66 (BP Location: Left Arm)   Pulse 89   Temp 98.9 F (37.2 C) (Oral)   Resp 18   Ht 5\' 4"  (1.626 m)   Wt 148 lb 9.4 oz (67.4 kg)   SpO2 99%   BMI 25.51 kg/m   Physical Exam RLQ drain intact, insertion site ok, mildly tender; output 150 cc purulent light brown fluid; heart- RRR; chest- CTA bilat ant  Imaging: Ct Abdomen Pelvis W Contrast  Result Date: 05/22/2016 CLINICAL DATA:  46 year old female with right lower quadrant pain, nausea, vomiting and diarrhea for 3 weeks. Prior gallbladder surgery. Initial encounter. EXAM: CT ABDOMEN AND PELVIS WITH CONTRAST TECHNIQUE: Multidetector CT imaging of the abdomen and pelvis was performed using the standard protocol following bolus administration of intravenous contrast. CONTRAST:  ISOVUE-300 IOPAMIDOL (ISOVUE-300) INJECTION 61% COMPARISON:  None. FINDINGS: Lower chest: Minimal atelectasis. Heart size within normal limits. Prominent dense breast parenchyma with coarse calcifications. Hepatobiliary: No worrisome hepatic lesion. Very mild fatty infiltration. Minimally lobular contour without other findings of cirrhosis. Post cholecystectomy. Pancreas: No mass or inflammation. Spleen: No mass or enlargement. Adrenals/Urinary Tract: No renal or ureteral obstructing stone or hydronephrosis. Tiny  low-density structure right kidney may represent a tiny angiomyelolipoma. No adrenal mass. Decompressed urinary bladder without gross abnormality. Stomach/Bowel: Marked inflammatory process in the right lower quadrant which I suspect is related to appendicitis with rupture and 9 x 6 x 6.5 cm abscess displacing adjacent terminal ileum and cecum (which are secondarily irritated). Other causes such as Meckel's diverticulum or tumor felt to be much less likely. Vascular/Lymphatic: No abdominal aortic aneurysm or large vessel occlusion. Inflammatory process immediately adjacent to right iliac vessels without significant compression or thrombosis. Lymph nodes within small bowel mesentery probably reactive in origin. Reproductive: No worrisome primary adnexal or uterine abnormality. Other: Gas within abscess but without surrounding free intraperitoneal air otherwise noted. Musculoskeletal: No worrisome osseous abnormality. IMPRESSION: Marked inflammatory process in the right lower quadrant which I suspect is related to appendicitis with rupture and 9 x 6 x 6.5 cm abscess displacing adjacent terminal ileum and cecum (which are secondarily irritated). Inflammatory process immediately adjacent to right iliac vessels without significant compression or thrombosis. These results were called by telephone at the time of interpretation on 05/22/2016 at 6:36 pm to Dr. Meredith Staggers , who verbally acknowledged these results. Electronically Signed   By: Lacy Duverney M.D.   On: 05/22/2016 18:59   Ct Image Guided Drainage By Percutaneous Catheter  Result Date: 05/23/2016 CLINICAL DATA:  Appendiceal abscess. EXAM: CT GUIDED DRAINAGE OF PELVIC ABSCESS ANESTHESIA/SEDATION: Intravenous Fentanyl and Versed were administered as conscious sedation during continuous monitoring of the patient's level of consciousness and physiological / cardiorespiratory status by the radiology RN, with a total moderate sedation time of 10 minutes.  PROCEDURE: The procedure, risks, benefits, and alternatives were explained to the patient. Questions regarding the  procedure were encouraged and answered. The patient understands and consents to the procedure. Patient placed supine. Select axial scans through the abdomen obtained. The collection was localized and the skin entry site was determined and marked. The operative field was prepped with chlorhexidinein a sterile fashion, and a sterile drape was applied covering the operative field. A sterile gown and sterile gloves were used for the procedure. Local anesthesia was provided with 1% Lidocaine. Under CT fluoroscopic guidance, an 18 gauge trocar needle was advanced into the collection. Purulent material could be aspirated. An Amplatz guidewire advanced easily within the collection, position confirmed on CT. Tract dilated to facilitate placement of a 12 French pigtail drain catheter, placed centrally within the collection, confirmed on CT. 30 mL of purulent aspirate were sent for Gram stain and culture. Catheter secured externally with 0 Prolene suture and StatLock and placed to gravity bag. The patient tolerated the procedure well. COMPLICATIONS: None immediate FINDINGS: Complex right lower quadrant pelvic abscess was again localized. Twelve French drain catheter placed under CT guidance as above. IMPRESSION: 1. Technically successful CT-guided right lower quadrant pelvic abscess drain catheter placement. A sample of the aspirate was sent for Gram stain and culture. Electronically Signed   By: Corlis Leak  Hassell M.D.   On: 05/23/2016 17:16    Labs:  CBC:  Recent Labs  05/22/16 1139 05/23/16 0449 05/24/16 0446  WBC 17.0* 15.8* 19.2*  HGB 10.5* 10.3* 10.0*  HCT 31.4* 31.4* 30.7*  PLT  --  349 331    COAGS:  Recent Labs  05/23/16 1049  INR 1.31    BMP:  Recent Labs  05/22/16 1956 05/23/16 0449 05/24/16 0446  NA 133* 134* 132*  K 3.8 3.7 4.1  CL 99* 99* 100*  CO2 25 25 24   GLUCOSE 101*  120* 160*  BUN 6 6 <5*  CALCIUM 8.7* 8.4* 8.3*  CREATININE 0.61 0.64 0.80  GFRNONAA >60 >60 >60  GFRAA >60 >60 >60    LIVER FUNCTION TESTS:  Recent Labs  05/22/16 1956 05/23/16 0449  BILITOT 0.9 1.2  AST 21 18  ALT 20 18  ALKPHOS 113 109  PROT 7.8 7.5  ALBUMIN 3.1* 2.9*    Assessment and Plan: Appendiceal abscess, s/p drainage 5/15 with postprocedure temp spike, now AF; WBC 19.2(15.8), hgb 10(10.3), creat nl; drain fluid cx pend- check sensitivities; hydrate; if diarrhea persists send stool for studies/cx; cont drain/NS flushes; check CT within 1 week of drain placement or sooner if clinical status worsens; monitor labs closely; other plans as per CCS   Electronically Signed: D. Lysle MoralesKevin Allred,PAC 05/24/2016, 9:43 AM   I spent a total of 20 minutes at the the patient's bedside AND on the patient's hospital floor or unit, greater than 50% of which was counseling/coordinating care for right pelvic abscess drain    Patient ID: Glenda Wilcox, female   DOB: 05/16/1970, 46 y.o.   MRN: [id number]

## 2016-05-25 LAB — CBC
HEMATOCRIT: 30.4 % — AB (ref 36.0–46.0)
HEMOGLOBIN: 9.8 g/dL — AB (ref 12.0–15.0)
MCH: 28.2 pg (ref 26.0–34.0)
MCHC: 32.2 g/dL (ref 30.0–36.0)
MCV: 87.4 fL (ref 78.0–100.0)
Platelets: 333 10*3/uL (ref 150–400)
RBC: 3.48 MIL/uL — AB (ref 3.87–5.11)
RDW: 13.7 % (ref 11.5–15.5)
WBC: 19.1 10*3/uL — ABNORMAL HIGH (ref 4.0–10.5)

## 2016-05-25 LAB — BASIC METABOLIC PANEL
Anion gap: 9 (ref 5–15)
BUN: 6 mg/dL (ref 6–20)
CHLORIDE: 103 mmol/L (ref 101–111)
CO2: 22 mmol/L (ref 22–32)
CREATININE: 0.56 mg/dL (ref 0.44–1.00)
Calcium: 8.1 mg/dL — ABNORMAL LOW (ref 8.9–10.3)
GFR calc Af Amer: >60 mL/min (ref >60)
GFR calc non Af Amer: >60 mL/min (ref >60)
Glucose, Bld: 84 mg/dL (ref 65–99)
POTASSIUM: 3.9 mmol/L (ref 3.5–5.1)
SODIUM: 134 mmol/L — AB (ref 135–145)

## 2016-05-25 MED ORDER — BOOST / RESOURCE BREEZE PO LIQD
1.0000 | Freq: Three times a day (TID) | ORAL | Status: DC
  Administered 2016-05-25 – 2016-05-29 (×10): 1 via ORAL

## 2016-05-25 NOTE — Progress Notes (Signed)
Progress Note: General Surgery Service   Assessment/Plan: Patient Active Problem List   Diagnosis Date Noted  . Right lower quadrant abdominal pain 05/22/2016  . Acute appendicitis with peritoneal abscess s/p perc drainage 05/23/2016 05/22/2016   s/p  Per drain, WBC still 19 -continue ABX -advance to clear liquids   LOS: 3 days  Chief Complaint/Subjective: No nausea, pain improved, ambulating, diarrhea this morning  Objective: Vital signs in last 24 hours: Temp:  [99.2 F (37.3 C)-100.1 F (37.8 C)] 99.2 F (37.3 C) (05/17 0443) Pulse Rate:  [84-96] 93 (05/17 0443) Resp:  [16-18] 16 (05/17 0443) BP: (108-122)/(62-67) 122/66 (05/17 0443) SpO2:  [95 %-100 %] 95 % (05/17 0443) Last BM Date: 05/24/16  Intake/Output from previous day: 05/16 0701 - 05/17 0700 In: 2509.2 [I.V.:2504.2] Out: 255 [Urine:200; Drains:55] Intake/Output this shift: No intake/output data recorded.  Lungs: CTAB  Cardiovascular: RRR  Abd: soft, slight pain in lower abdomen, drain with feculent drainage  Extremities: no edema  Neuro: AOx4  Lab Results: CBC   Recent Labs  05/24/16 0446 05/25/16 0436  WBC 19.2* 19.1*  HGB 10.0* 9.8*  HCT 30.7* 30.4*  PLT 331 333   BMET  Recent Labs  05/24/16 0446 05/25/16 0436  NA 132* 134*  K 4.1 3.9  CL 100* 103  CO2 24 22  GLUCOSE 160* 84  BUN <5* 6  CREATININE 0.80 0.56  CALCIUM 8.3* 8.1*   PT/INR  Recent Labs  05/23/16 1049  LABPROT 16.4*  INR 1.31   ABG No results for input(s): PHART, HCO3 in the last 72 hours.  Invalid input(s): PCO2, PO2  Studies/Results:  Anti-infectives: Anti-infectives    Start     Dose/Rate Route Frequency Ordered Stop   05/22/16 2000  piperacillin-tazobactam (ZOSYN) IVPB 3.375 g     3.375 g 12.5 mL/hr over 240 Minutes Intravenous Every 8 hours 05/22/16 1949        Medications: Scheduled Meds: . enoxaparin (LOVENOX) injection  40 mg Subcutaneous Q24H   Continuous Infusions: . 0.9 % NaCl with  KCl 20 mEq / L 125 mL/hr at 05/25/16 0600  . piperacillin-tazobactam (ZOSYN)  IV 3.375 g (05/25/16 0512)   PRN Meds:.acetaminophen, HYDROcodone-acetaminophen, morphine injection, ondansetron **OR** ondansetron (ZOFRAN) IV  Rodman PickleLuke Aaron Alvetta Hidrogo, MD Pg# [phone] [facility]

## 2016-05-25 NOTE — Progress Notes (Signed)
Pt reported to RNs at shift change that she's had 4 episodes of diarrhea today. Pt is on Zocyn. Night shift RN will obtain stool sample from pt. Pt aware and agreeable to plan. Stools documented in pt chart.

## 2016-05-25 NOTE — Progress Notes (Signed)
Referring Physician(s): Newman,D  Supervising Physician: Simonne ComeWatts, John  Patient Status:  [facility] - [facility]  Chief Complaint: Appendiceal abscess   Subjective: Pt feeling a little better; still has some abd discomfort and watery stools; denies N/V; has ambulated short distances   Allergies: Patient has no known allergies.  Medications: Prior to Admission medications   Medication Sig Start Date End Date Taking? Authorizing Provider  ibuprofen (ADVIL,MOTRIN) 200 MG tablet Take 200 mg by mouth every 6 (six) hours as needed.   Yes [provider]     Vital Signs: BP 122/66 (BP Location: Right Arm)   Pulse 93   Temp 99.2 F (37.3 C) (Oral) Comment: reported rn  Resp 16   Ht 5\' 4"  (1.626 m)   Wt 148 lb 9.4 oz (67.4 kg)   SpO2 95%   BMI 25.51 kg/m   Physical Exam awake/alert; RLQ drain intact, output yesterday 55 cc purulent beige colored fluid, feculent appearing; insertion site ok, mildly tender  Imaging: Ct Abdomen Pelvis W Contrast  Result Date: 05/22/2016 CLINICAL DATA:  46 year old female with right lower quadrant pain, nausea, vomiting and diarrhea for 3 weeks. Prior gallbladder surgery. Initial encounter. EXAM: CT ABDOMEN AND PELVIS WITH CONTRAST TECHNIQUE: Multidetector CT imaging of the abdomen and pelvis was performed using the standard protocol following bolus administration of intravenous contrast. CONTRAST:  100mL ISOVUE-300 IOPAMIDOL (ISOVUE-300) INJECTION 61% COMPARISON:  None. FINDINGS: Lower chest: Minimal atelectasis. Heart size within normal limits. Prominent dense breast parenchyma with coarse calcifications. Hepatobiliary: No worrisome hepatic lesion. Very mild fatty infiltration. Minimally lobular contour without other findings of cirrhosis. Post cholecystectomy. Pancreas: No mass or inflammation. Spleen: No mass or enlargement. Adrenals/Urinary Tract: No renal or ureteral obstructing stone or hydronephrosis. Tiny low-density structure right kidney may  represent a tiny angiomyelolipoma. No adrenal mass. Decompressed urinary bladder without gross abnormality. Stomach/Bowel: Marked inflammatory process in the right lower quadrant which I suspect is related to appendicitis with rupture and 9 x 6 x 6.5 cm abscess displacing adjacent terminal ileum and cecum (which are secondarily irritated). Other causes such as Meckel's diverticulum or tumor felt to be much less likely. Vascular/Lymphatic: No abdominal aortic aneurysm or large vessel occlusion. Inflammatory process immediately adjacent to right iliac vessels without significant compression or thrombosis. Lymph nodes within small bowel mesentery probably reactive in origin. Reproductive: No worrisome primary adnexal or uterine abnormality. Other: Gas within abscess but without surrounding free intraperitoneal air otherwise noted. Musculoskeletal: No worrisome osseous abnormality. IMPRESSION: Marked inflammatory process in the right lower quadrant which I suspect is related to appendicitis with rupture and 9 x 6 x 6.5 cm abscess displacing adjacent terminal ileum and cecum (which are secondarily irritated). Inflammatory process immediately adjacent to right iliac vessels without significant compression or thrombosis. These results were called by telephone at the time of interpretation on 05/22/2016 at 6:36 pm to Dr. Meredith StaggersJEFFREY GREENE , who verbally acknowledged these results. Electronically Signed   By: Lacy DuverneySteven  Olson M.D.   On: 05/22/2016 18:59   Ct Image Guided Drainage By Percutaneous Catheter  Result Date: 05/23/2016 CLINICAL DATA:  Appendiceal abscess. EXAM: CT GUIDED DRAINAGE OF PELVIC ABSCESS ANESTHESIA/SEDATION: Intravenous Fentanyl and Versed were administered as conscious sedation during continuous monitoring of the patient's level of consciousness and physiological / cardiorespiratory status by the radiology RN, with a total moderate sedation time of 10 minutes. PROCEDURE: The procedure, risks, benefits, and  alternatives were explained to the patient. Questions regarding the procedure were encouraged and answered. The patient understands  and consents to the procedure. Patient placed supine. Select axial scans through the abdomen obtained. The collection was localized and the skin entry site was determined and marked. The operative field was prepped with chlorhexidinein a sterile fashion, and a sterile drape was applied covering the operative field. A sterile gown and sterile gloves were used for the procedure. Local anesthesia was provided with 1% Lidocaine. Under CT fluoroscopic guidance, an 18 gauge trocar needle was advanced into the collection. Purulent material could be aspirated. An Amplatz guidewire advanced easily within the collection, position confirmed on CT. Tract dilated to facilitate placement of a 12 French pigtail drain catheter, placed centrally within the collection, confirmed on CT. 30 mL of purulent aspirate were sent for Gram stain and culture. Catheter secured externally with 0 Prolene suture and StatLock and placed to gravity bag. The patient tolerated the procedure well. COMPLICATIONS: None immediate FINDINGS: Complex right lower quadrant pelvic abscess was again localized. Twelve French drain catheter placed under CT guidance as above. IMPRESSION: 1. Technically successful CT-guided right lower quadrant pelvic abscess drain catheter placement. A sample of the aspirate was sent for Gram stain and culture. Electronically Signed   By: Corlis Leak M.D.   On: 05/23/2016 17:16    Labs:  CBC:  Recent Labs  05/22/16 1139 05/23/16 0449 05/24/16 0446 05/25/16 0436  WBC 17.0* 15.8* 19.2* 19.1*  HGB 10.5* 10.3* 10.0* 9.8*  HCT 31.4* 31.4* 30.7* 30.4*  PLT  --  349 331 333    COAGS:  Recent Labs  05/23/16 1049  INR 1.31    BMP:  Recent Labs  05/22/16 1956 05/23/16 0449 05/24/16 0446 05/25/16 0436  NA 133* 134* 132* 134*  K 3.8 3.7 4.1 3.9  CL 99* 99* 100* 103  CO2 25 25  24 22   GLUCOSE 101* 120* 160* 84  BUN 6 6 <5* 6  CALCIUM 8.7* 8.4* 8.3* 8.1*  CREATININE 0.61 0.64 0.80 0.56  GFRNONAA >60 >60 >60 >60  GFRAA >60 >60 >60 >60    LIVER FUNCTION TESTS:  Recent Labs  05/22/16 1956 05/23/16 0449  BILITOT 0.9 1.2  AST 21 18  ALT 20 18  ALKPHOS 113 109  PROT 7.8 7.5  ALBUMIN 3.1* 2.9*    Assessment and Plan: Appendiceal abscess, s/p drainage 5/15 ; temp 99.2; WBC 19.1(19.2), hgb 9.8 (10); creat nl; drain fluid cx pending; cont drain irrigation; monitor labs;OOB; IS;  check f/u CT /drain injection within 1 week of drain placement or sooner if clinical status worsens;  other plans as per [facility]   Electronically Signed: D. Lysle Morales 05/25/2016, 10:01 AM   I spent a total of 15 minutes at the the patient's bedside AND on the patient's hospital floor or unit, greater than 50% of which was counseling/coordinating care for right pelvic abscess drain    Patient ID: Glenda Wilcox, female   DOB: 12-Mar-1970, 46 y.o.   MRN: [id number]

## 2016-05-26 LAB — BASIC METABOLIC PANEL
ANION GAP: 8 (ref 5–15)
BUN: <5 mg/dL — ABNORMAL LOW (ref 6–20)
CO2: 25 mmol/L (ref 22–32)
Calcium: 8.4 mg/dL — ABNORMAL LOW (ref 8.9–10.3)
Chloride: 104 mmol/L (ref 101–111)
Creatinine, Ser: 0.65 mg/dL (ref 0.44–1.00)
GLUCOSE: 91 mg/dL (ref 65–99)
POTASSIUM: 3.9 mmol/L (ref 3.5–5.1)
Sodium: 137 mmol/L (ref 135–145)

## 2016-05-26 LAB — CBC
HCT: 29.9 % — ABNORMAL LOW (ref 36.0–46.0)
Hemoglobin: 9.9 g/dL — ABNORMAL LOW (ref 12.0–15.0)
MCH: 28.6 pg (ref 26.0–34.0)
MCHC: 33.1 g/dL (ref 30.0–36.0)
MCV: 86.4 fL (ref 78.0–100.0)
Platelets: 361 10*3/uL (ref 150–400)
RBC: 3.46 MIL/uL — AB (ref 3.87–5.11)
RDW: 14 % (ref 11.5–15.5)
WBC: 20.2 10*3/uL — AB (ref 4.0–10.5)

## 2016-05-26 MED ORDER — SACCHAROMYCES BOULARDII 250 MG PO CAPS
250.0000 mg | ORAL_CAPSULE | Freq: Two times a day (BID) | ORAL | Status: DC
  Administered 2016-05-26 – 2016-05-31 (×11): 250 mg via ORAL
  Filled 2016-05-26 (×11): qty 1

## 2016-05-26 NOTE — Progress Notes (Signed)
Referring Physician(s): Ovidio Kin  Supervising Physician: Malachy Moan  Patient Status:  [facility] - In-pt  Chief Complaint:  Appendiceal abscess S/P drain by Dr. Deanne Coffer 05/23/2016  Subjective:  Pt feels better. She asked if the drain was coming out today. I explained that these drains can be in place for a few weeks sometimes. She was not happy with that.  Allergies: Patient has no known allergies.  Medications: Prior to Admission medications   Medication Sig Start Date End Date Taking? Authorizing Provider  ibuprofen (ADVIL,MOTRIN) 200 MG tablet Take 200 mg by mouth every 6 (six) hours as needed.   Yes [provider]     Vital Signs: BP 126/63 (BP Location: Left Arm)   Pulse 89   Temp 99.5 F (37.5 C) (Oral)   Resp 16   Ht 5\' 4"  (1.626 m)   Wt 148 lb 9.4 oz (67.4 kg)   SpO2 100%   BMI 25.51 kg/m   Physical Exam Awake and alert NAD Drain in place Purulent brownish drainage in gravity bag ~30 mL recorded and about 20 mL in bag. Flushes easily  Imaging: Ct Abdomen Pelvis W Contrast  Result Date: 05/22/2016 CLINICAL DATA:  46 year old female with right lower quadrant pain, nausea, vomiting and diarrhea for 3 weeks. Prior gallbladder surgery. Initial encounter. EXAM: CT ABDOMEN AND PELVIS WITH CONTRAST TECHNIQUE: Multidetector CT imaging of the abdomen and pelvis was performed using the standard protocol following bolus administration of intravenous contrast. CONTRAST:  ISOVUE-300 IOPAMIDOL (ISOVUE-300) INJECTION 61% COMPARISON:  None. FINDINGS: Lower chest: Minimal atelectasis. Heart size within normal limits. Prominent dense breast parenchyma with coarse calcifications. Hepatobiliary: No worrisome hepatic lesion. Very mild fatty infiltration. Minimally lobular contour without other findings of cirrhosis. Post cholecystectomy. Pancreas: No mass or inflammation. Spleen: No mass or enlargement. Adrenals/Urinary Tract: No renal or ureteral  obstructing stone or hydronephrosis. Tiny low-density structure right kidney may represent a tiny angiomyelolipoma. No adrenal mass. Decompressed urinary bladder without gross abnormality. Stomach/Bowel: Marked inflammatory process in the right lower quadrant which I suspect is related to appendicitis with rupture and 9 x 6 x 6.5 cm abscess displacing adjacent terminal ileum and cecum (which are secondarily irritated). Other causes such as Meckel's diverticulum or tumor felt to be much less likely. Vascular/Lymphatic: No abdominal aortic aneurysm or large vessel occlusion. Inflammatory process immediately adjacent to right iliac vessels without significant compression or thrombosis. Lymph nodes within small bowel mesentery probably reactive in origin. Reproductive: No worrisome primary adnexal or uterine abnormality. Other: Gas within abscess but without surrounding free intraperitoneal air otherwise noted. Musculoskeletal: No worrisome osseous abnormality. IMPRESSION: Marked inflammatory process in the right lower quadrant which I suspect is related to appendicitis with rupture and 9 x 6 x 6.5 cm abscess displacing adjacent terminal ileum and cecum (which are secondarily irritated). Inflammatory process immediately adjacent to right iliac vessels without significant compression or thrombosis. These results were called by telephone at the time of interpretation on 05/22/2016 at 6:36 pm to Dr. Meredith Staggers , who verbally acknowledged these results. Electronically Signed   By: Lacy Duverney M.D.   On: 05/22/2016 18:59   Ct Image Guided Drainage By Percutaneous Catheter  Result Date: 05/23/2016 CLINICAL DATA:  Appendiceal abscess. EXAM: CT GUIDED DRAINAGE OF PELVIC ABSCESS ANESTHESIA/SEDATION: Intravenous Fentanyl and Versed were administered as conscious sedation during continuous monitoring of the patient's level of consciousness and physiological / cardiorespiratory status by the radiology RN, with a total  moderate sedation time of 10 minutes.  PROCEDURE: The procedure, risks, benefits, and alternatives were explained to the patient. Questions regarding the procedure were encouraged and answered. The patient understands and consents to the procedure. Patient placed supine. Select axial scans through the abdomen obtained. The collection was localized and the skin entry site was determined and marked. The operative field was prepped with chlorhexidinein a sterile fashion, and a sterile drape was applied covering the operative field. A sterile gown and sterile gloves were used for the procedure. Local anesthesia was provided with 1% Lidocaine. Under CT fluoroscopic guidance, an 18 gauge trocar needle was advanced into the collection. Purulent material could be aspirated. An Amplatz guidewire advanced easily within the collection, position confirmed on CT. Tract dilated to facilitate placement of a 12 French pigtail drain catheter, placed centrally within the collection, confirmed on CT. 30 mL of purulent aspirate were sent for Gram stain and culture. Catheter secured externally with 0 Prolene suture and StatLock and placed to gravity bag. The patient tolerated the procedure well. COMPLICATIONS: None immediate FINDINGS: Complex right lower quadrant pelvic abscess was again localized. Twelve French drain catheter placed under CT guidance as above. IMPRESSION: 1. Technically successful CT-guided right lower quadrant pelvic abscess drain catheter placement. A sample of the aspirate was sent for Gram stain and culture. Electronically Signed   By: Corlis Leak  Hassell M.D.   On: 05/23/2016 17:16    Labs:  CBC:  Recent Labs  05/23/16 0449 05/24/16 0446 05/25/16 0436 05/26/16 0444  WBC 15.8* 19.2* 19.1* 20.2*  HGB 10.3* 10.0* 9.8* 9.9*  HCT 31.4* 30.7* 30.4* 29.9*  PLT 349 331 333 361    COAGS:  Recent Labs  05/23/16 1049  INR 1.31    BMP:  Recent Labs  05/23/16 0449 05/24/16 0446 05/25/16 0436  05/26/16 0444  NA 134* 132* 134* 137  K 3.7 4.1 3.9 3.9  CL 99* 100* 103 104  CO2 25 24 22 25   GLUCOSE 120* 160* 84 91  BUN 6 <5* 6 <5*  CALCIUM 8.4* 8.3* 8.1* 8.4*  CREATININE 0.64 0.80 0.56 0.65  GFRNONAA >60 >60 >60 >60  GFRAA >60 >60 >60 >60    LIVER FUNCTION TESTS:  Recent Labs  05/22/16 1956 05/23/16 0449  BILITOT 0.9 1.2  AST 21 18  ALT 20 18  ALKPHOS 113 109  PROT 7.8 7.5  ALBUMIN 3.1* 2.9*    Assessment and Plan:  Appendiceal abscess S/P drain by Dr. Deanne CofferHassell 05/23/2016  WBC 20 today (19 yesterday) Tmax 101.7 last night , currently afebrile.  Continue routine drain care with flushes as ordered.  Repeat CT in a week or when drainage has significantly decreased or if condition worsens.  Electronically Signed: Gwynneth MacleodWENDY S Khoury Siemon, PA-C 05/26/2016, 2:24 PM   I spent a total of 15 Minutes at the the patient's bedside AND on the patient's hospital floor or unit, greater than 50% of which was counseling/coordinating care for f/u drain.

## 2016-05-26 NOTE — Progress Notes (Signed)
    CC: Abdominal pain  Subjective: She says she feels better, she still tender in the right lower quadrant, specifically where the IR drain is. Denies fever. Taking clears without difficulty.  Objective: Vital signs in last 24 hours: Temp:  [98.7 F (37.1 C)-101.7 F (38.7 C)] 99.5 F (37.5 C) (05/18 0533) Pulse Rate:  [89-100] 89 (05/18 0533) Resp:  [16] 16 (05/18 0533) BP: (102-126)/(61-72) 126/63 (05/18 0533) SpO2:  [98 %-100 %] 100 % (05/18 0533) Last BM Date: 05/25/16 Nothing. Recorded PO 2600 IV Urine not recorded Drain 30 BM 4 Afebrile MAXIMUM TEMPERATURE 99.5 VSS  WBC is up to 20.2. CT scan 05/22/16  05/23/16  IR drain placement  Intake/Output from previous day: 05/17 0701 - 05/18 0700 In: 2615 [I.V.:2250; IV Piggyback:350] Out: 30 [Drains:30] Intake/Output this shift: No intake/output data recorded.  General appearance: alert, cooperative and no distress Resp: clear to auscultation bilaterally GI: Soft, tender over the right lower quadrant at the drain site. Positive bowel sounds no significant distention.  Lab Results:   Recent Labs  05/25/16 0436 05/26/16 0444  WBC 19.1* 20.2*  HGB 9.8* 9.9*  HCT 30.4* 29.9*  PLT 333 361    BMET  Recent Labs  05/25/16 0436 05/26/16 0444  NA 134* 137  K 3.9 3.9  CL 103 104  CO2 22 25  GLUCOSE 84 91  BUN 6 <5*  CREATININE 0.56 0.65  CALCIUM 8.1* 8.4*   PT/INR No results for input(s): LABPROT, INR in the last 72 hours.   Recent Labs Lab 05/22/16 1956 05/23/16 0449  AST 21 18  ALT 20 18  ALKPHOS 113 109  BILITOT 0.9 1.2  PROT 7.8 7.5  ALBUMIN 3.1* 2.9*     Lipase  No results found for: LIPASE   Medications: . enoxaparin (LOVENOX) injection  40 mg Subcutaneous Q24H  . feeding supplement  1 Container Oral TID BM   . 0.9 % NaCl with KCl 20 mEq / L 125 mL/hr (05/26/16 0512)  . piperacillin-tazobactam (ZOSYN)  IV Stopped (05/26/16 0911)    Assessment/Plan Abscess, RLQ, probably  secondary to ruptured appendicitis S/p IR drain placement 05/23/16 Dr. Raelene Bottaniel Hassel ID:  Zosyn 5/14=>> day 5 FEN: IV fluids/Clear liquids DVT:  SCDs/ Lovenox     Plan: Continue IV antibiotics this will be day 5. I'm going to leave her on clears. Recheck CBC in a.m.  Original CT scan was 5/14, if WBC continues to climb may consider repeat CT scan over the weekend. Decrease IV fluids.  LOS: 4 days    Glenda Wilcox 05/26/[phone]

## 2016-05-26 NOTE — Progress Notes (Addendum)
Notified MD about fever overnight,  Patient is lready on Zosyn.  Ordered to give tylenol.  Temperature decreased to 98.8 on recheck.    Also patient appears to be in pain, but is refusing to take any pain medication for pain on her side.

## 2016-05-27 LAB — CBC
HCT: 27.8 % — ABNORMAL LOW (ref 36.0–46.0)
Hemoglobin: 9.2 g/dL — ABNORMAL LOW (ref 12.0–15.0)
MCH: 28.6 pg (ref 26.0–34.0)
MCHC: 33.1 g/dL (ref 30.0–36.0)
MCV: 86.3 fL (ref 78.0–100.0)
PLATELETS: 391 10*3/uL (ref 150–400)
RBC: 3.22 MIL/uL — ABNORMAL LOW (ref 3.87–5.11)
RDW: 14 % (ref 11.5–15.5)
WBC: 14.2 10*3/uL — AB (ref 4.0–10.5)

## 2016-05-27 LAB — AEROBIC/ANAEROBIC CULTURE W GRAM STAIN (SURGICAL/DEEP WOUND)

## 2016-05-27 LAB — BASIC METABOLIC PANEL
Anion gap: 9 (ref 5–15)
CALCIUM: 8.3 mg/dL — AB (ref 8.9–10.3)
CO2: 25 mmol/L (ref 22–32)
Chloride: 104 mmol/L (ref 101–111)
Creatinine, Ser: 0.44 mg/dL (ref 0.44–1.00)
GFR calc Af Amer: >60 mL/min (ref >60)
GLUCOSE: 101 mg/dL — AB (ref 65–99)
Potassium: 4 mmol/L (ref 3.5–5.1)
SODIUM: 138 mmol/L (ref 135–145)

## 2016-05-27 LAB — AEROBIC/ANAEROBIC CULTURE (SURGICAL/DEEP WOUND)

## 2016-05-27 NOTE — Progress Notes (Signed)
   Subjective/Chief Complaint: Complains of some pain around right ribs   Objective: Vital signs in last 24 hours: Temp:  [98.8 F (37.1 C)-99.2 F (37.3 C)] 99.2 F (37.3 C) (05/19 0540) Pulse Rate:  [85-92] 85 (05/19 0540) Resp:  [16-20] 20 (05/19 0540) BP: (112-117)/(63-74) 117/74 (05/19 0540) SpO2:  [100 %] 100 % (05/19 0540) Last BM Date: 05/26/16  Intake/Output from previous day: 05/18 0701 - 05/19 0700 In: 8340 [I.V.:8285; IV Piggyback:50] Out: 0  Intake/Output this shift: Total I/O In: -  Out: 700 [Urine:700]  General appearance: alert and cooperative Resp: clear to auscultation bilaterally Cardio: regular rate and rhythm GI: soft, minimal tenderness. good bs. drain output purulent  Lab Results:   Recent Labs  05/26/16 0444 05/27/16 0510  WBC 20.2* 14.2*  HGB 9.9* 9.2*  HCT 29.9* 27.8*  PLT 361 391   BMET  Recent Labs  05/26/16 0444 05/27/16 0510  NA 137 138  K 3.9 4.0  CL 104 104  CO2 25 25  GLUCOSE 91 101*  BUN <5* <5*  CREATININE 0.65 0.44  CALCIUM 8.4* 8.3*   PT/INR No results for input(s): LABPROT, INR in the last 72 hours. ABG No results for input(s): PHART, HCO3 in the last 72 hours.  Invalid input(s): PCO2, PO2  Studies/Results: No results found.  Anti-infectives: Anti-infectives    Start     Dose/Rate Route Frequency Ordered Stop   05/22/16 2000  piperacillin-tazobactam (ZOSYN) IVPB 3.375 g     3.375 g 12.5 mL/hr over 240 Minutes Intravenous Every 8 hours 05/22/16 1949        Assessment/Plan: s/p * No surgery found * Advance diet  Continue drain and zosyn ambulate  LOS: 5 days    TOTH III,Patricia Perales S 05/27/2016

## 2016-05-28 NOTE — Progress Notes (Signed)
   Subjective/Chief Complaint: Feels fine, having bowel function, no pain, no n/v   Objective: Vital signs in last 24 hours: Temp:  [98.2 F (36.8 C)-99.2 F (37.3 C)] 99.2 F (37.3 C) (05/20 0619) Pulse Rate:  [83-95] 83 (05/20 0619) Resp:  [16] 16 (05/20 0619) BP: (105-122)/(67-78) 109/67 (05/20 0619) SpO2:  [99 %-100 %] 99 % (05/20 0619) Last BM Date: 05/27/16  Intake/Output from previous day: 05/19 0701 - 05/20 0700 In: 2697.5 [P.O.:600; I.V.:1887.5; IV Piggyback:200] Out: 750 [Urine:700; Drains:50] Intake/Output this shift: No intake/output data recorded.  Resp: clear to auscultation bilaterally Cardio: regular rate and rhythm GI: drain with clear output, soft nontender  Lab Results:   Recent Labs  05/26/16 0444 05/27/16 0510  WBC 20.2* 14.2*  HGB 9.9* 9.2*  HCT 29.9* 27.8*  PLT 361 391   BMET  Recent Labs  05/26/16 0444 05/27/16 0510  NA 137 138  K 3.9 4.0  CL 104 104  CO2 25 25  GLUCOSE 91 101*  BUN <5* <5*  CREATININE 0.65 0.44  CALCIUM 8.4* 8.3*     Anti-infectives: Anti-infectives    Start     Dose/Rate Route Frequency Ordered Stop   05/22/16 2000  piperacillin-tazobactam (ZOSYN) IVPB 3.375 g     3.375 g 12.5 mL/hr over 240 Minutes Intravenous Every 8 hours 05/22/16 1949        Assessment/Plan: Abscess, RLQ, probably secondary to ruptured appendicitis S/p IR drain placement 05/23/16 Dr. Raelene Bottaniel Hassel ID: Zosyn 5/14=>>day 6. Wbc finally coming down today, no fevers, will continue abx, needs repeat ct tomorrow FEN: IV fluids/advance to regular diet today DVT: SCDs/ Lovenox  If resolves will need colonoscopy as small chance related to process besides appendicitis dispo pending wbc and repeat ct scan  [facility] Center[facility]KEFIELD,Glenda Wilcox 05/28/2016

## 2016-05-29 ENCOUNTER — Encounter (HOSPITAL_COMMUNITY): Payer: Self-pay | Admitting: Radiology

## 2016-05-29 ENCOUNTER — Inpatient Hospital Stay (HOSPITAL_COMMUNITY): Payer: BLUE CROSS/BLUE SHIELD

## 2016-05-29 LAB — CBC
HCT: 32 % — ABNORMAL LOW (ref 36.0–46.0)
HEMOGLOBIN: 10.2 g/dL — AB (ref 12.0–15.0)
MCH: 27.9 pg (ref 26.0–34.0)
MCHC: 31.9 g/dL (ref 30.0–36.0)
MCV: 87.4 fL (ref 78.0–100.0)
PLATELETS: 476 10*3/uL — AB (ref 150–400)
RBC: 3.66 MIL/uL — AB (ref 3.87–5.11)
RDW: 14 % (ref 11.5–15.5)
WBC: 9.2 10*3/uL (ref 4.0–10.5)

## 2016-05-29 MED ORDER — CEFPODOXIME PROXETIL 200 MG PO TABS
200.0000 mg | ORAL_TABLET | Freq: Two times a day (BID) | ORAL | Status: DC
  Administered 2016-05-29 – 2016-05-31 (×5): 200 mg via ORAL
  Filled 2016-05-29 (×5): qty 1

## 2016-05-29 MED ORDER — IOPAMIDOL (ISOVUE-300) INJECTION 61%
100.0000 mL | Freq: Once | INTRAVENOUS | Status: AC | PRN
  Administered 2016-05-29: 100 mL via INTRAVENOUS

## 2016-05-29 MED ORDER — IOPAMIDOL (ISOVUE-300) INJECTION 61%: 15.0000 mL | Freq: Two times a day (BID) | INTRAVENOUS | Status: DC | PRN

## 2016-05-29 MED ORDER — AMOXICILLIN-POT CLAVULANATE 875-125 MG PO TABS
1.0000 | ORAL_TABLET | Freq: Two times a day (BID) | ORAL | Status: DC
  Administered 2016-05-29: 1 via ORAL
  Filled 2016-05-29: qty 1

## 2016-05-29 MED ORDER — IOPAMIDOL (ISOVUE-300) INJECTION 61%
INTRAVENOUS | Status: AC
  Filled 2016-05-29: qty 100

## 2016-05-29 MED ORDER — METRONIDAZOLE 500 MG PO TABS
500.0000 mg | ORAL_TABLET | Freq: Three times a day (TID) | ORAL | Status: DC
  Administered 2016-05-29 – 2016-05-31 (×6): 500 mg via ORAL
  Filled 2016-05-29 (×6): qty 1

## 2016-05-29 NOTE — Progress Notes (Signed)
[facility] Progress Note     Subjective: CC: RLQ abscess  Patient denies abdominal pain. Tolerating regular diet without N/V. +flatus and having bowel movements. Urinating without hesitancy.  Afebrile, VSS, leukocytosis resolved Objective: Vital signs in last 24 hours: Temp:  [98.3 F (36.8 C)-98.8 F (37.1 C)] 98.3 F (36.8 C) (05/21 0513) Pulse Rate:  [85-87] 85 (05/21 0513) Resp:  [16] 16 (05/21 0513) BP: (115-120)/(75-79) 115/79 (05/21 0513) SpO2:  [99 %-100 %] 99 % (05/21 0513) Last BM Date: 05/28/16  Intake/Output from previous day: 05/20 0701 - 05/21 0700 In: 2095.4 [P.O.:720; I.V.:1150.4; IV Piggyback:200] Out: 30 [Drains:30] Intake/Output this shift: No intake/output data recorded.  PE: Gen:  Alert, NAD, pleasant and cooperative HEENT: EOM's in tact, no scleral icterus  Card:  Regular rate and rhythm, pedal pulses 2+ BL Pulm:  Normal effort, clear to auscultation bilaterally Abd: Soft, non-tender, non-distended, bowel sounds present in all 4 quadrants, RLQ drain site c/d/i with minimal purulent output in bag.   Drain: 30 cc/24h, purulent Skin: warm and dry, no rashes  Psych: A&Ox3   Lab Results:   Recent Labs  05/27/16 0510 05/29/16 0429  WBC 14.2* 9.2  HGB 9.2* 10.2*  HCT 27.8* 32.0*  PLT 391 476*   BMET  Recent Labs  05/27/16 0510  NA 138  K 4.0  CL 104  CO2 25  GLUCOSE 101*  BUN <5*  CREATININE 0.44  CALCIUM 8.3*   PT/INR No results for input(s): LABPROT, INR in the last 72 hours. CMP     Component Value Date/Time   NA 138 05/27/2016 0510   K 4.0 05/27/2016 0510   CL 104 05/27/2016 0510   CO2 25 05/27/2016 0510   GLUCOSE 101 (H) 05/27/2016 0510   BUN <5 (L) 05/27/2016 0510   CREATININE 0.44 05/27/2016 0510   CALCIUM 8.3 (L) 05/27/2016 0510   PROT 7.5 05/23/2016 0449   ALBUMIN 2.9 (L) 05/23/2016 0449   AST 18 05/23/2016 0449   ALT 18 05/23/2016 0449   ALKPHOS 109 05/23/2016 0449   BILITOT 1.2 05/23/2016 0449   GFRNONAA >60 05/27/2016 0510   GFRAA >60 05/27/2016 0510   Lipase  No results found for: LIPASE     Studies/Results: No results found.  Anti-infectives: Anti-infectives    Start     Dose/Rate Route Frequency Ordered Stop   05/22/16 2000  piperacillin-tazobactam (ZOSYN) IVPB 3.375 g     3.375 g 12.5 mL/hr over 240 Minutes Intravenous Every 8 hours 05/22/16 1949         Assessment/Plan Abscess, RLQ, suspect secondary to ruptured appendicitis S/p IR drain placement 05/23/16 Dr. Raelene Bottaniel Hassel - repeat CT scan today to evaluate improvement of RLQ pelvic abscess - will need outpatient colonoscopy to confirm diagnosis related to ruptured appendix and not diverticulitis or other infectious process - Outpatient follow up with Dr. Gerrit FriendsGerkin to discuss interval appendectomy  - Outpatient follow up with IR. Timing of removal per IR.     ID: Zosyn 5/14 >> Day#7; leukocytosis resolved, d/c IV abx and start PO abx based on sensitivities  FEN: Regular diet BJY:NWGN/VT:SCDs/ Lovenox   Anticipate discharge in 24-48 h   LOS: 7 days    Adam PhenixElizabeth S Simaan , [facility] 05/29/2016, 8:17 AM Pager: [phone] Consults: [phone] Mon-Fri 7:00 am-4:30 pm Sat-Sun 7:00 am-11:30 am

## 2016-05-29 NOTE — Progress Notes (Signed)
Referring Physician(s): Newman,D  Supervising Physician: Simonne ComeWatts, John  Patient Status:  [facility]  Chief Complaint: Appendiceal abscess   Subjective: Patient states she feels better today. She is on a regular diet. She denies nausea or vomiting.   Allergies: Patient has no known allergies.  Medications: Prior to Admission medications   Medication Sig Start Date End Date Taking? Authorizing Provider  ibuprofen (ADVIL,MOTRIN) 200 MG tablet Take 200 mg by mouth every 6 (six) hours as needed.   Yes [provider]     Vital Signs: BP 115/79 (BP Location: Right Arm)   Pulse 85   Temp 98.3 F (36.8 C) (Oral)   Resp 16   Ht 5\' 4"  (1.626 m)   Wt 148 lb 9.4 oz (67.4 kg)   SpO2 99%   BMI 25.51 kg/m   Physical Exam right lower quadrant drain intact, output yesterday 30 mL, minimal amount fluid in bag today.  Imaging: Ct Abdomen Pelvis W Contrast  Result Date: 05/29/2016 CLINICAL DATA:  Follow-up appendiceal abscess status post drainage. EXAM: CT ABDOMEN AND PELVIS WITH CONTRAST TECHNIQUE: Multidetector CT imaging of the abdomen and pelvis was performed using the standard protocol following bolus administration of intravenous contrast. CONTRAST:  100mL ISOVUE-300 IOPAMIDOL (ISOVUE-300) INJECTION 61% COMPARISON:  05/22/2016 FINDINGS: Lower chest:  No acute finding Hepatobiliary: Visualized portions are negative.No evidence of biliary obstruction or stone. Pancreas: Visualized portions are negative. Spleen: Unremarkable. Adrenals/Urinary Tract: Negative adrenals. No hydronephrosis or stone. 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 Unremarkable bladder. Stomach/Bowel: Appendix seen extending medially from the cecal base, with broad area nonenhancing wall along the inferior margin. There is a right lower quadrant drain which is in stable position from placement CT. Significant improvement in the associated abscess, now only with few gas bubbles. Discrete 22 x 16 mm collection  inferior to the cecum, which may not communicate with the drained abscess. There is a new rim enhancing collection in the rectovaginal recess measuring 7 cm. Vascular/Lymphatic: No acute vascular abnormality. No mass or adenopathy. Reproductive:Interval development of bilateral hydrosalpinx that is mild, likely due to obstruction by pelvic abscess. Other: Collections as described above.  No free pneumoperitoneum. Musculoskeletal: No acute abnormalities. IMPRESSION: 1. New 7 cm collection in the rectovaginal recess. 2. New 16 x 22 mm collection in the low right peritoneum. 3. Successful drainage of the right lower quadrant abscess, with no residual fluid around the catheter. 4. Broad necrosis along the base and mid portion of the appendix. 5. Interval bilateral hydrosalpinx. Electronically Signed   By: Marnee SpringJonathon  Watts M.D.   On: 05/29/2016 14:29    Labs:  CBC:  Recent Labs  05/25/16 0436 05/26/16 0444 05/27/16 0510 05/29/16 0429  WBC 19.1* 20.2* 14.2* 9.2  HGB 9.8* 9.9* 9.2* 10.2*  HCT 30.4* 29.9* 27.8* 32.0*  PLT 333 361 391 476*    COAGS:  Recent Labs  05/23/16 1049  INR 1.31    BMP:  Recent Labs  05/24/16 0446 05/25/16 0436 05/26/16 0444 05/27/16 0510  NA 132* 134* 137 138  K 4.1 3.9 3.9 4.0  CL 100* 103 104 104  CO2 24 22 25 25   GLUCOSE 160* 84 91 101*  BUN <5* 6 <5* <5*  CALCIUM 8.3* 8.1* 8.4* 8.3*  CREATININE 0.80 0.56 0.65 0.44  GFRNONAA >60 >60 >60 >60  GFRAA >60 >60 >60 >60    LIVER FUNCTION TESTS:  Recent Labs  05/22/16 1956 05/23/16 0449  BILITOT 0.9 1.2  AST 21 18  ALT  20 18  ALKPHOS 113 109  PROT 7.8 7.5  ALBUMIN 3.1* 2.9*    Assessment and Plan: Appendiceal abscess, s/p drainage 5/15 ; afebrile, WBC today normal,  drain fluid cultures growing Escherichia coli, viridans Streptococcus,and  Bacteroides species. Follow-up CT today reveals no residual fluid around the drained right lower quadrant abscess but new 7 cm collection in the rectovaginal  recess as well as 1.6 x 2.2 cm collection in the right peritoneum. Await CCS follow-up before any further intervention.   Electronically Signed: D. Jeananne Rama, PA-C 05/29/2016, 3:28 PM   I spent a total of 15 minutes  at the the patient's bedside AND on the patient's hospital floor or unit, greater than 50% of which was counseling/coordinating care for right lower quadrant abscess drain    Patient ID: Glenda Wilcox, female   DOB: 11/08/1970, 46 y.o.   MRN: [id number]

## 2016-05-29 NOTE — Progress Notes (Addendum)
Discharge planning, spoke with patient and family at bedside. They have no preference of HH agency. Contacted [facility] for referral. They will provide nursing to assist with drain care. Anticipate d/c today after CT if cleared by surgery. [phone]

## 2016-05-29 NOTE — Progress Notes (Signed)
Pt possibly going home with drain. Began dressing change and flush education with patient. Patient will need continued education. Will continue to monitor.

## 2016-05-30 ENCOUNTER — Inpatient Hospital Stay (HOSPITAL_COMMUNITY): Payer: BLUE CROSS/BLUE SHIELD

## 2016-05-30 LAB — COMPREHENSIVE METABOLIC PANEL
ALT: 56 U/L — ABNORMAL HIGH (ref 14–54)
ANION GAP: 7 (ref 5–15)
AST: 50 U/L — AB (ref 15–41)
Albumin: 2.7 g/dL — ABNORMAL LOW (ref 3.5–5.0)
Alkaline Phosphatase: 133 U/L — ABNORMAL HIGH (ref 38–126)
BUN: 6 mg/dL (ref 6–20)
CHLORIDE: 102 mmol/L (ref 101–111)
CO2: 26 mmol/L (ref 22–32)
Calcium: 8.9 mg/dL (ref 8.9–10.3)
Creatinine, Ser: 0.54 mg/dL (ref 0.44–1.00)
GFR calc Af Amer: >60 mL/min (ref >60)
Glucose, Bld: 116 mg/dL — ABNORMAL HIGH (ref 65–99)
POTASSIUM: 3.8 mmol/L (ref 3.5–5.1)
Sodium: 135 mmol/L (ref 135–145)
Total Bilirubin: 0.3 mg/dL (ref 0.3–1.2)
Total Protein: 7.7 g/dL (ref 6.5–8.1)

## 2016-05-30 LAB — CBC
HCT: 33 % — ABNORMAL LOW (ref 36.0–46.0)
Hemoglobin: 10.5 g/dL — ABNORMAL LOW (ref 12.0–15.0)
MCH: 27.8 pg (ref 26.0–34.0)
MCHC: 31.8 g/dL (ref 30.0–36.0)
MCV: 87.3 fL (ref 78.0–100.0)
PLATELETS: 528 10*3/uL — AB (ref 150–400)
RBC: 3.78 MIL/uL — ABNORMAL LOW (ref 3.87–5.11)
RDW: 13.9 % (ref 11.5–15.5)
WBC: 9.5 10*3/uL (ref 4.0–10.5)

## 2016-05-30 MED ORDER — FENTANYL CITRATE (PF) 100 MCG/2ML IJ SOLN
INTRAMUSCULAR | Status: AC | PRN
  Administered 2016-05-30 (×2): 50 ug via INTRAVENOUS

## 2016-05-30 MED ORDER — METRONIDAZOLE 500 MG PO TABS: 500.0000 mg | ORAL_TABLET | Freq: Three times a day (TID) | ORAL | 0 refills | Status: DC

## 2016-05-30 MED ORDER — MIDAZOLAM HCL 2 MG/2ML IJ SOLN
INTRAMUSCULAR | Status: AC | PRN
  Administered 2016-05-30 (×2): 1 mg via INTRAVENOUS

## 2016-05-30 MED ORDER — MIDAZOLAM HCL 2 MG/2ML IJ SOLN
INTRAMUSCULAR | Status: AC
  Filled 2016-05-30: qty 6

## 2016-05-30 MED ORDER — FENTANYL CITRATE (PF) 100 MCG/2ML IJ SOLN
INTRAMUSCULAR | Status: AC
  Filled 2016-05-30: qty 4

## 2016-05-30 MED ORDER — SACCHAROMYCES BOULARDII 250 MG PO CAPS: 250.0000 mg | ORAL_CAPSULE | Freq: Two times a day (BID) | ORAL | Status: DC

## 2016-05-30 MED ORDER — HYDROCODONE-ACETAMINOPHEN 5-325 MG PO TABS: 1.0000 | ORAL_TABLET | Freq: Four times a day (QID) | ORAL | 0 refills | Status: DC | PRN

## 2016-05-30 MED ORDER — CEFPODOXIME PROXETIL 200 MG PO TABS: 200.0000 mg | ORAL_TABLET | Freq: Two times a day (BID) | ORAL | 0 refills | Status: DC

## 2016-05-30 NOTE — Progress Notes (Signed)
Patient ID: Glenda Wilcox, female   DOB: 1970-12-27, 46 y.o.   MRN: [id number]  [facility] Surgery Progress Note     Subjective: CC: RLQ abscess  No complaints this morning. Denies abdominal pain, nausea, or vomiting. BM yesterday.  IR to drain new pelvic abscess today seen on CT scan yesterday.  Objective: Vital signs in last 24 hours: Temp:  [98.1 F (36.7 C)-99 F (37.2 C)] 98.1 F (36.7 C) (05/22 0535) Pulse Rate:  [80-81] 80 (05/22 0535) Resp:  [16] 16 (05/22 0535) BP: (114-117)/(68-69) 114/69 (05/22 0535) SpO2:  [100 %] 100 % (05/22 0535) Last BM Date: 05/29/16  Intake/Output from previous day: 05/21 0701 - 05/22 0700 In: 478.3 [P.O.:120; I.V.:343.3] Out: 45 [Drains:45] Intake/Output this shift: No intake/output data recorded.  PE: Gen:  Alert, NAD, pleasant Card:  RRR, no M/G/R heard Pulm:  CTAB, no W/R/R, effort normal Abd: Soft, non-tender, non-distended, bowel sounds present in all 4 quadrants, RLQ drain site c/d/i with minimal purulent output in bag Ext:  No erythema, edema, or tenderness BUE/BLE  Lab Results:   Recent Labs  05/29/16 0429 05/30/16 0355  WBC 9.2 9.5  HGB 10.2* 10.5*  HCT 32.0* 33.0*  PLT 476* 528*   BMET  Recent Labs  05/30/16 0355  NA 135  K 3.8  CL 102  CO2 26  GLUCOSE 116*  BUN 6  CREATININE 0.54  CALCIUM 8.9   PT/INR No results for input(s): LABPROT, INR in the last 72 hours. CMP     Component Value Date/Time   NA 135 05/30/2016 0355   K 3.8 05/30/2016 0355   CL 102 05/30/2016 0355   CO2 26 05/30/2016 0355   GLUCOSE 116 (H) 05/30/2016 0355   BUN 6 05/30/2016 0355   CREATININE 0.54 05/30/2016 0355   CALCIUM 8.9 05/30/2016 0355   PROT 7.7 05/30/2016 0355   ALBUMIN 2.7 (L) 05/30/2016 0355   AST 50 (H) 05/30/2016 0355   ALT 56 (H) 05/30/2016 0355   ALKPHOS 133 (H) 05/30/2016 0355   BILITOT 0.3 05/30/2016 0355   GFRNONAA >60 05/30/2016 0355   GFRAA >60 05/30/2016 0355   Lipase  No results found for:  LIPASE     Studies/Results: Ct Abdomen Pelvis W Contrast  Result Date: 05/29/2016 CLINICAL DATA:  Follow-up appendiceal abscess status post drainage. EXAM: CT ABDOMEN AND PELVIS WITH CONTRAST TECHNIQUE: Multidetector CT imaging of the abdomen and pelvis was performed using the standard protocol following bolus administration of intravenous contrast. CONTRAST:  ISOVUE-300 IOPAMIDOL (ISOVUE-300) INJECTION 61% COMPARISON:  05/22/2016 FINDINGS: Lower chest:  No acute finding Hepatobiliary: Visualized portions are negative.No evidence of biliary obstruction or stone. Pancreas: Visualized portions are negative. Spleen: Unremarkable. Adrenals/Urinary Tract: Negative adrenals. No hydronephrosis or stone. 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 Unremarkable bladder. Stomach/Bowel: Appendix seen extending medially from the cecal base, with broad area nonenhancing wall along the inferior margin. There is a right lower quadrant drain which is in stable position from placement CT. Significant improvement in the associated abscess, now only with few gas bubbles. Discrete 22 x 16 mm collection inferior to the cecum, which may not communicate with the drained abscess. There is a new rim enhancing collection in the rectovaginal recess measuring 7 cm. Vascular/Lymphatic: No acute vascular abnormality. No mass or adenopathy. Reproductive:Interval development of bilateral hydrosalpinx that is mild, likely due to obstruction by pelvic abscess. Other: Collections as described above.  No free pneumoperitoneum. Musculoskeletal: No acute abnormalities. IMPRESSION: 1. New 7 cm collection in the  rectovaginal recess. 2. New 16 x 22 mm collection in the low right peritoneum. 3. Successful drainage of the right lower quadrant abscess, with no residual fluid around the catheter. 4. Broad necrosis along the base and mid portion of the appendix. 5. Interval bilateral hydrosalpinx. Electronically Signed   By: Marnee SpringJonathon  Watts M.D.   On:  05/29/2016 14:29    Anti-infectives: Anti-infectives    Start     Dose/Rate Route Frequency Ordered Stop   05/29/16 1000  amoxicillin-clavulanate (AUGMENTIN) 875-125 MG per tablet 1 tablet  Status:  Discontinued     1 tablet Oral Every 12 hours 05/29/16 0847 05/29/16 0946   05/29/16 1000  cefpodoxime (VANTIN) tablet 200 mg     200 mg Oral Every 12 hours 05/29/16 0946     05/29/16 1000  metroNIDAZOLE (FLAGYL) tablet 500 mg     500 mg Oral Every 8 hours 05/29/16 0946     05/22/16 2000  piperacillin-tazobactam (ZOSYN) IVPB 3.375 g  Status:  Discontinued     3.375 g 12.5 mL/hr over 240 Minutes Intravenous Every 8 hours 05/22/16 1949 05/29/16 0847       Assessment/Plan Abscess, RLQ, suspect secondary to ruptured appendicitis S/p IR drain placement 05/23/16 Dr. Raelene Bottaniel Hassel - repeat CT 5/21 showed resolution of this abscess, but it also showed a new pelvic abscess - will need outpatient colonoscopy to confirm diagnosis related to ruptured appendix and not diverticulitis or other infectious process - Outpatient follow up with Dr. Gerrit FriendsGerkin to discuss interval appendectomy  - Outpatient follow up with IR. Timing of removal per IR.     ID: Zosyn 5/14 >> 5/21; vantin/flagyl 5/21>>day#2 FEN: NPO for procedure ZOX:WRUE/VT:SCDs/ Lovenox   Plan: Drain today with IR. Continue first drain as well. Ok to return to regular diet after procedure. Hopefully home later today on oral abx after procedure.   LOS: 8 days    Edson SnowballBROOKE A MILLER , [facility] 05/30/2016, 7:40 AM Pager: [phone] Consults: [phone] Mon-Fri 7:00 am-4:30 pm Sat-Sun 7:00 am-11:30 am

## 2016-05-30 NOTE — Procedures (Signed)
Interventional Radiology Procedure Note  Procedure: Placement of a 53F drain into the pelvic abscess via right transgluteal approach.  Aspiration yields 80 mL purulent material.   Complications: None  Estimated Blood Loss: None  Recommendations: - Drain to JP bulb suction  Signed,  Sterling BigHeath K. Esther Broyles, MD

## 2016-05-30 NOTE — Discharge Instructions (Signed)
Percutaneous Abscess Drain, Care After °This sheet gives you information about how to care for yourself after your procedure. Your health care provider may also give you more specific instructions. If you have problems or questions, contact your health care provider. °What can I expect after the procedure? °After your procedure, it is common to have: °· A small amount of bruising and discomfort in the area where the drainage tube (catheter) was placed. °· Sleepiness and fatigue. This should go away after the medicines you were given have worn off. ° °Follow these instructions at home: °Incision care °· Follow instructions from your health care provider about how to take care of your incision. Make sure you: °? Wash your hands with soap and water before you change your bandage (dressing). If soap and water are not available, use hand sanitizer. °? Change your dressing as told by your health care provider. °? Leave stitches (sutures), skin glue, or adhesive strips in place. These skin closures may need to stay in place for 2 weeks or longer. If adhesive strip edges start to loosen and curl up, you may trim the loose edges. Do not remove adhesive strips completely unless your health care provider tells you to do that. °· Check your incision area every day for signs of infection. Check for: °? More redness, swelling, or pain. °? More fluid or blood. °? Warmth. °? Pus or a bad smell. °? Fluid leaking from around your catheter (instead of fluid draining through your catheter). °Catheter care °· Follow instructions from your health care provider about emptying and cleaning your catheter and collection bag. You may need to clean the catheter every day so it does not clog. °· If directed, write down the following information every time you empty your bag: °? The date and time. °? The amount of drainage. °General instructions °· Rest at home for 1-2 days after your procedure. Return to your normal activities as told by your  health care provider. °· Do not take baths, swim, or use a hot tub for 24 hours after your procedure, or until your health care provider says that this is okay. °· Take over-the-counter and prescription medicines only as told by your health care provider. °· Keep all follow-up visits as told by your health care provider. This is important. °Contact a health care provider if: °· You have less than 10 mL of drainage a day for 2-3 days in a row, or as directed by your health care provider. °· You have more redness, swelling, or pain around your incision area. °· You have more fluid or blood coming from your incision area. °· Your incision area feels warm to the touch. °· You have pus or a bad smell coming from your incision area. °· You have fluid leaking from around your catheter (instead of through your catheter). °· You have a fever or chills. °· You have pain that does not get better with medicine. °Get help right away if: °· Your catheter comes out. °· You suddenly stop having drainage from your catheter. °· You suddenly have blood in the fluid that is draining from your catheter. °· You become dizzy or you faint. °· You develop a rash. °· You have nausea or vomiting. °· You have difficulty breathing or you feel short of breath. °· You develop chest pain. °· You have problems with your speech or vision. °· You have trouble balancing or moving your arms or legs. °Summary °· It is common to have a small   amount of bruising and discomfort in the area where the drainage tube (catheter) was placed. °· You may be directed to record the amount of drainage from the bag every time you empty it. °· Follow instructions from your health care provider about emptying and cleaning your catheter and collection bag. °This information is not intended to replace advice given to you by your health care provider. Make sure you discuss any questions you have with your health care provider. °Document Released: 05/12/2013 Document  Revised: 11/18/2015 Document Reviewed: 11/18/2015 °Elsevier Interactive Patient Education © 2017 [employer]. ° °

## 2016-05-30 NOTE — Progress Notes (Signed)
Pt flushed percutaneous drain with good aseptic technique, demonstrating understanding.

## 2016-05-30 NOTE — Progress Notes (Signed)
Patient was able to flush her own drain ,with little assistance. We will continue to assess and monitor the pt.

## 2016-05-30 NOTE — Progress Notes (Signed)
Referring Physician(s): Connor,C  Supervising Physician: Malachy MoanMcCullough, Heath  Patient [facility]:  [name]  Chief Complaint:  Appendiceal/pelvic abscesses  Subjective: Pt doing ok this am; has some mild rectal discomfort and sl tender RLQ; denies n/v, fever,CP, dyspnea   Allergies: Patient has no known allergies.  Medications: Prior to Admission medications   Medication Sig Start Date End Date Taking? Authorizing Provider  ibuprofen (ADVIL,MOTRIN) 200 MG tablet Take 200 mg by mouth every 6 (six) hours as needed.   Yes [provider]     Vital Signs: BP 114/69 (BP Location: Right Arm)   Pulse 80   Temp 98.1 F (36.7 C) (Oral)   Resp 16   Ht 5\' 4"  (1.626 m)   Wt 148 lb 9.4 oz (67.4 kg)   SpO2 100%   BMI 25.51 kg/m   Physical Exam awake/alert; RLQ drain intact, insertion site mildly tender; output 45 cc; chest- CTA bilat; heart- RRR; abd- soft,+BS,ND; ext- no edema  Imaging: Ct Abdomen Pelvis W Contrast  Result Date: 05/29/2016 CLINICAL DATA:  Follow-up appendiceal abscess status post drainage. EXAM: CT ABDOMEN AND PELVIS WITH CONTRAST TECHNIQUE: Multidetector CT imaging of the abdomen and pelvis was performed using the standard protocol following bolus administration of intravenous contrast. CONTRAST:  100mL ISOVUE-300 IOPAMIDOL (ISOVUE-300) INJECTION 61% COMPARISON:  05/22/2016 FINDINGS: Lower chest:  No acute finding Hepatobiliary: Visualized portions are negative.No evidence of biliary obstruction or stone. Pancreas: Visualized portions are negative. Spleen: Unremarkable. Adrenals/Urinary Tract: Negative adrenals. No hydronephrosis or stone. 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 Unremarkable bladder. Stomach/Bowel: Appendix seen extending medially from the cecal base, with broad area nonenhancing wall along the inferior margin. There is a right lower quadrant drain which is in stable position from placement CT. Significant improvement in the associated abscess,  now only with few gas bubbles. Discrete 22 x 16 mm collection inferior to the cecum, which may not communicate with the drained abscess. There is a new rim enhancing collection in the rectovaginal recess measuring 7 cm. Vascular/Lymphatic: No acute vascular abnormality. No mass or adenopathy. Reproductive:Interval development of bilateral hydrosalpinx that is mild, likely due to obstruction by pelvic abscess. Other: Collections as described above.  No free pneumoperitoneum. Musculoskeletal: No acute abnormalities. IMPRESSION: 1. New 7 cm collection in the rectovaginal recess. 2. New 16 x 22 mm collection in the low right peritoneum. 3. Successful drainage of the right lower quadrant abscess, with no residual fluid around the catheter. 4. Broad necrosis along the base and mid portion of the appendix. 5. Interval bilateral hydrosalpinx. Electronically Signed   By: Marnee SpringJonathon  Watts M.D.   On: 05/29/2016 14:29    Labs:  CBC:  Recent Labs  05/26/16 0444 05/27/16 0510 05/29/16 0429 05/30/16 0355  WBC 20.2* 14.2* 9.2 9.5  HGB 9.9* 9.2* 10.2* 10.5*  HCT 29.9* 27.8* 32.0* 33.0*  PLT 361 391 476* 528*    COAGS:  Recent Labs  05/23/16 1049  INR 1.31    BMP:  Recent Labs  05/25/16 0436 05/26/16 0444 05/27/16 0510 05/30/16 0355  NA 134* 137 138 135  K 3.9 3.9 4.0 3.8  CL 103 104 104 102  CO2 22 25 25 26   GLUCOSE 84 91 101* 116*  BUN 6 <5* <5* 6  CALCIUM 8.1* 8.4* 8.3* 8.9  CREATININE 0.56 0.65 0.44 0.54  GFRNONAA >60 >60 >60 >60  GFRAA >60 >60 >60 >60    LIVER FUNCTION TESTS:  Recent Labs  05/22/16 1956 05/23/16 0449 05/30/16 0355  BILITOT 0.9 1.2 0.3  AST 21 18 50*  ALT 20 18 56*  ALKPHOS 113 109 133*  PROT 7.8 7.5 7.7  ALBUMIN 3.1* 2.9* 2.7*    Assessment and Plan: Appendiceal abscess, s/p drainage 5/15 ; afebrile; WBC nl; hgb stable, creat nl; no residual fluid seen around drained RLQ abscess on 5/21 CT but maintain RLQ drain for now as output still sig; for  drainage of new pelvic fluid collection/abscess today; Risks and benefits discussed with the patient including bleeding, infection, damage to adjacent structures, bowel perforation/fistula connection, and sepsis.All of the patient's questions were answered, patient is agreeable to proceed.Consent signed and in chart.     Electronically Signed: D. Jeananne Rama, PA-C 05/30/2016, 9:32 AM   I spent a total of 20 minutes at the the patient's bedside AND on the patient's hospital floor or unit, greater than 50% of which was counseling/coordinating care for RLQ abscess drain    Patient ID: Glenda Wilcox, female   DOB: May 29, 1970, 46 y.o.   MRN: [id number]

## 2016-05-31 ENCOUNTER — Other Ambulatory Visit: Payer: Self-pay | Admitting: Radiology

## 2016-05-31 DIAGNOSIS — K3533 Acute appendicitis with perforation and localized peritonitis, with abscess: Secondary | ICD-10-CM

## 2016-05-31 DIAGNOSIS — R1031 Right lower quadrant pain: Secondary | ICD-10-CM

## 2016-05-31 MED ORDER — ENOXAPARIN SODIUM 40 MG/0.4ML ~~LOC~~ SOLN: 40.0000 mg | Freq: Every day | SUBCUTANEOUS | Status: DC

## 2016-05-31 NOTE — Progress Notes (Signed)
MEDICATION-RELATED CONSULT NOTE   IR Procedure Consult - Anticoagulant/Antiplatelet PTA/Inpatient Med List Review by Pharmacist    Procedure: Abscess drain    Completed: 05/30/16 at 1719  Post-Procedural bleeding risk per IR MD assessment:  Standard  Antithrombotic medications on inpatient or PTA profile prior to procedure:   Lovenox 40 mg SQ q24h (Last dose given 5/21 at 1153)    Recommended restart time per IR Post-Procedure Guidelines:  Day +1   Other considerations:      Plan:     Resume Lovenox 40 mg SQ q24h on 5/23 at 1200    [facility] HospitalChristine Oretta Berkland PharmD, BCPS Pager [phone] 05/31/2016 7:49 AM

## 2016-05-31 NOTE — Progress Notes (Signed)
Pt's vitals are WNL, tolerating diet and pain is under control. Discussed discharge instructions with both patient and family members. Both patient and caretaker able to return demonstrated emptying, flushing and dressing change. Patient has number to make follow up appointment with IR for drains. Discharge to home with prescription and drain supplies.

## 2016-05-31 NOTE — Discharge Summary (Signed)
[facility] Discharge Summary   Patient ID: Glenda Wilcox MRN: [id number] DOB/AGE: 1970/01/29 46 y.o.  Admit date: 05/22/2016 Discharge date: 05/31/2016  Admitting Diagnosis: Acute appendicitis Pelvic abscess  Discharge Diagnosis Patient Active Problem List   Diagnosis Date Noted  . Right lower quadrant abdominal pain 05/22/2016  . Acute appendicitis with peritoneal abscess s/p perc drainage 05/23/2016 05/22/2016    Consultants Oley Balm MD - interventional radiology  Imaging: Ct Abdomen Pelvis W Contrast  Result Date: 05/29/2016 CLINICAL DATA:  Follow-up appendiceal abscess status post drainage. EXAM: CT ABDOMEN AND PELVIS WITH CONTRAST TECHNIQUE: Multidetector CT imaging of the abdomen and pelvis was performed using the standard protocol following bolus administration of intravenous contrast. CONTRAST:  ISOVUE-300 IOPAMIDOL (ISOVUE-300) INJECTION 61% COMPARISON:  05/22/2016 FINDINGS: Lower chest:  No acute finding Hepatobiliary: Visualized portions are negative.No evidence of biliary obstruction or stone. Pancreas: Visualized portions are negative. Spleen: Unremarkable. Adrenals/Urinary Tract: Negative adrenals. No hydronephrosis or stone. 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 Unremarkable bladder. Stomach/Bowel: Appendix seen extending medially from the cecal base, with broad area nonenhancing wall along the inferior margin. There is a right lower quadrant drain which is in stable position from placement CT. Significant improvement in the associated abscess, now only with few gas bubbles. Discrete 22 x 16 mm collection inferior to the cecum, which may not communicate with the drained abscess. There is a new rim enhancing collection in the rectovaginal recess measuring 7 cm. Vascular/Lymphatic: No acute vascular abnormality. No mass or adenopathy. Reproductive:Interval development of bilateral hydrosalpinx that is mild, likely due to obstruction by pelvic abscess. Other:  Collections as described above.  No free pneumoperitoneum. Musculoskeletal: No acute abnormalities. IMPRESSION: 1. New 7 cm collection in the rectovaginal recess. 2. New 16 x 22 mm collection in the low right peritoneum. 3. Successful drainage of the right lower quadrant abscess, with no residual fluid around the catheter. 4. Broad necrosis along the base and mid portion of the appendix. 5. Interval bilateral hydrosalpinx. Electronically Signed   By: Marnee Spring M.D.   On: 05/29/2016 14:29   Ct Image Guided Drainage By Percutaneous Catheter  Result Date: 05/30/2016 INDICATION: 46 year old female with history of perforated appendicitis. She had a drain placed into the abscess in the right lower quadrant on 05/23/2016. She has had persistent pelvic pain and fevers. Repeat CT imaging on 05/29/2016 demonstrates a new abscess in the pelvic cul-de-sac. She presents for trans gluteal drain placement. EXAM: CT GUIDED DRAINAGE OF  ABSCESS MEDICATIONS: The patient is currently admitted to the hospital and receiving intravenous antibiotics. The antibiotics were administered within an appropriate time frame prior to the initiation of the procedure. ANESTHESIA/SEDATION: 2 mg IV Versed 100 mcg IV Fentanyl Moderate Sedation Time:  21 minutes The patient was continuously monitored during the procedure by the interventional radiology nurse under my direct supervision. COMPLICATIONS: None immediate. TECHNIQUE: Informed written consent was obtained from the patient after a thorough discussion of the procedural risks, benefits and alternatives. All questions were addressed. A timeout was performed prior to the initiation of the procedure. PROCEDURE: The operative field was prepped with Chlorhexidine in a sterile fashion, and a sterile drape was applied covering the operative field. A sterile gown and sterile gloves were used for the procedure. Local anesthesia was provided with 1% Lidocaine. A planning axial CT scan was  performed. The pelvic cul-de-sac complex fluid collection was successfully identified. A suitable skin entry site was selected and marked. Local anesthesia was attained by infiltration with 1%  lidocaine. A small dermatotomy was made. Under intermittent CT guidance, a 18 gauge introducer needle was carefully advanced adjacent to the sacrum and into the pelvic fluid collection. A short Amplatz wire was then coiled within the fluid collection. The needle was removed. The skin tract was dilated to 10 [place] and a Cook 10.2 [place]French all-purpose drainage catheter was advanced over the wire and formed in the abscess collection. Aspiration yields approximately 80 mL of thick purulent fluid. The abscess cavity was lavaged with 30 cc of sterile saline. The drainage catheter was then connected to JP bulb suction and secured to the skin with 0 Prolene suture. Post placement CT imaging demonstrates a well-positioned drainage catheter and no significant residual fluid. The patient tolerated the procedure well. FINDINGS: 80 mL frankly purulent material in the pelvic cul-de-sac abscess IMPRESSION: Successful placement of a right trans gluteal approach 10 French drainage catheter into the abscess in the pelvic cul-de-sac. Electronically Signed   By: Malachy MoanHeath  McCullough M.D.   On: 05/30/2016 17:44    Procedures None  Hospital Course:  Glenda ColumbiaZeinab Wilcox is a 46yo female who [facility] with 10+ days of abdominal pain, nausea, vomiting and diarrhea.  Workup showed leukocytosis and on CT scan an abdominal abscess thought to be from ruptured appendicitis.  Patient was admitted for IV antibiotics. IR was consulted and placed a percutaneous drain of the abscess 5/15. Tolerated procedure well.  Pain slowly improved and diet was advanced as tolerated.  CT scan was repeated on 5/21 and showed resolution of the initial abscess, but it also showed a new pelvic abscess. A second percutaneous drain was placed on 5/22.  Patient tolerated procedure well and was transitioned to oral antibiotics based on susceptibilities from first culture. On 5/23 the patient was voiding well, tolerating diet, ambulating well, pain well controlled, vital signs stable and felt stable for discharge home. She will continue both drains and vantin/flagyl. She will follow-up in drain clinic next week, and with Dr. Gerrit FriendsGerkin after that to discuss possible colonoscopy, and interval appendectomy. She knows to call with questions or concerns.    I have personally reviewed the patients medication history on the [place] controlled substance database.    Physical Exam: Gen:  Alert, NAD, pleasant Card:  RRR, no M/G/R heard Pulm:  CTAB, no W/R/R, effort normal Abd: Soft, non-tender, non-distended, bowel sounds present in all 4 quadrants, RLQ drain site c/d/i with minimal purulent output in bag; transgluteal drain with serous fluid in bag Ext:  No erythema, edema, or tenderness BUE/BLE  Allergies as of 05/31/2016   No Known Allergies     Medication List    TAKE these medications   cefpodoxime 200 MG tablet Commonly known as:  VANTIN Take 1 tablet (200 mg total) by mouth every 12 (twelve) hours.   HYDROcodone-acetaminophen 5-325 MG tablet Commonly known as:  NORCO/VICODIN Take 1-2 tablets by mouth every 6 (six) hours as needed for moderate pain.   ibuprofen 200 MG tablet Commonly known as:  ADVIL,MOTRIN Take 200 mg by mouth every 6 (six) hours as needed.   metroNIDAZOLE 500 MG tablet Commonly known as:  FLAGYL Take 1 tablet (500 mg total) by mouth every 8 (eight) hours.   saccharomyces boulardii 250 MG capsule Commonly known as:  FLORASTOR Take 1 capsule (250 mg total) by mouth 2 (two) times daily. You can find a probiotic over the counter.        Follow-up Information    Darnell LevelGerkin, Todd, MD.  Go on 06/19/2016.   Specialty:  General Surgery Why:  Your appointment is 06/19/16 @ 11:00AM. Please arrive 30 minutes prior to your appointment  to check in and fill out necessary paperwork. Contact information: [address] [place] [address] [phone]        Jobe Gibbon, [facility] Follow up.   Specialty:  Home Health Services Why:  nurse to assist with drain care Contact information: [address] [place] [address] [place] [address] [phone]           Signed: Edson Snowball, [facility] 05/31/2016, 9:06 AM Pager: [phone] Consults: [phone] Mon-Fri 7:00 am-4:30 pm Sat-Sun 7:00 am-11:30 am

## 2016-06-04 ENCOUNTER — Emergency Department (HOSPITAL_COMMUNITY)
Admission: EM | Admit: 2016-06-04 | Discharge: 2016-06-04 | Disposition: A | Discharge status: Home / Self Care | Payer: BLUE CROSS/BLUE SHIELD | Attending: Emergency Medicine | Admitting: Emergency Medicine

## 2016-06-04 ENCOUNTER — Encounter (HOSPITAL_COMMUNITY): Payer: Self-pay | Admitting: Emergency Medicine

## 2016-06-04 DIAGNOSIS — Z79899 Other long term (current) drug therapy: Secondary | ICD-10-CM | POA: Insufficient documentation

## 2016-06-04 DIAGNOSIS — Z5189 Encounter for other specified aftercare: Secondary | ICD-10-CM

## 2016-06-04 DIAGNOSIS — Z4800 Encounter for change or removal of nonsurgical wound dressing: Secondary | ICD-10-CM | POA: Insufficient documentation

## 2016-06-04 LAB — BASIC METABOLIC PANEL
ANION GAP: 5 (ref 5–15)
BUN: 9 mg/dL (ref 6–20)
CALCIUM: 9 mg/dL (ref 8.9–10.3)
CO2: 29 mmol/L (ref 22–32)
Chloride: 107 mmol/L (ref 101–111)
Creatinine, Ser: 0.63 mg/dL (ref 0.44–1.00)
Glucose, Bld: 119 mg/dL — ABNORMAL HIGH (ref 65–99)
POTASSIUM: 4 mmol/L (ref 3.5–5.1)
Sodium: 141 mmol/L (ref 135–145)

## 2016-06-04 LAB — CBC
HEMATOCRIT: 34.4 % — AB (ref 36.0–46.0)
Hemoglobin: 10.9 g/dL — ABNORMAL LOW (ref 12.0–15.0)
MCH: 28.2 pg (ref 26.0–34.0)
MCHC: 31.7 g/dL (ref 30.0–36.0)
MCV: 88.9 fL (ref 78.0–100.0)
PLATELETS: 431 10*3/uL — AB (ref 150–400)
RBC: 3.87 MIL/uL (ref 3.87–5.11)
RDW: 14.3 % (ref 11.5–15.5)
WBC: 6.2 10*3/uL (ref 4.0–10.5)

## 2016-06-04 NOTE — ED Triage Notes (Signed)
Pt from home with complaints of rectal pain and non-flushing drains following an abscess removal surgery 16 days ago. Per pt, she is supposed to flush her drains 2 x a day. She states she has been unable to flush the drains for 2 days. Pt states she has rectal pain only that she rates 5/10. Pt states they told her they would help her get home health to help her maintain her drains, but no one ever called/came. Pt also states she has tried to call her surgeon, but no one has called her back.

## 2016-06-04 NOTE — ED Provider Notes (Signed)
[facility]-EMERGENCY DEPT Provider Note   CSN: [id number] Arrival date & time: 06/04/16  1249     History   Chief Complaint Chief Complaint  Patient presents with  . Post-op Problem    HPI Lucianne LeiZeinab Camila LiOsman is a 46 y.o. female.  HPI Patient presents to the emergency room for evaluation of a change in her surgical wound drains. Patient was admitted to the hospital on May 14 for acute appendicitis associated with localized peritonitis and abscess formation.  Patient had surgical drains placed. She was discharged a few days ago. Patient states she was told home health was going to come to her home to help her with her wound care. No one has been there. Patient is also concerned about the decreased drainage output. Denies any fevers or chills. No increasing abdominal pain. History reviewed. No pertinent past medical history.  Patient Active Problem List   Diagnosis Date Noted  . Right lower quadrant abdominal pain 05/22/2016  . Acute appendicitis with peritoneal abscess s/p perc drainage 05/23/2016 05/22/2016    Past Surgical History:  Procedure Laterality Date  . abscess surgery    . gallstone removal      OB History    No data available       Home Medications    Prior to Admission medications   Medication Sig Start Date End Date Taking? Authorizing Provider  acetaminophen (TYLENOL) 325 MG tablet Take 650 mg by mouth every 6 (six) hours as needed for mild pain.   Yes [provider]  cefpodoxime (VANTIN) 200 MG tablet Take 1 tablet (200 mg total) by mouth every 12 (twelve) hours. 05/30/16  Yes Evangeline GulaMiller, Brooke A, PA-C  ibuprofen (ADVIL,MOTRIN) 200 MG tablet Take 200 mg by mouth every 6 (six) hours as needed.   Yes [provider]  metroNIDAZOLE (FLAGYL) 500 MG tablet Take 1 tablet (500 mg total) by mouth every 8 (eight) hours. 05/30/16  Yes Edson SnowballMiller, Brooke A, PA-C  saccharomyces boulardii (FLORASTOR) 250 MG capsule Take 1 capsule (250 mg total) by mouth 2 (two) times  daily. You can find a probiotic over the counter. 05/30/16  Yes Edson SnowballMiller, Brooke A, PA-C  HYDROcodone-acetaminophen (NORCO/VICODIN) 5-325 MG tablet Take 1-2 tablets by mouth every 6 (six) hours as needed for moderate pain. Patient not taking: Reported on 06/04/2016 05/30/16   Edson SnowballMiller, Brooke A, PA-C    Family History No family history on file.  Social History Social History  Substance Use Topics  . Smoking status: Never Smoker  . Smokeless tobacco: Never Used  . Alcohol use No     Allergies   Patient has no known allergies.   Review of Systems Review of Systems  All other systems reviewed and are negative.    Physical Exam Updated Vital Signs BP 118/73 (BP Location: Left Arm)   Pulse 88   Temp 98.4 F (36.9 C) (Oral)   Resp 16   SpO2 100%   Physical Exam  Constitutional: She appears well-developed and well-nourished. No distress.  HENT:  Head: Normocephalic and atraumatic.  Right Ear: External ear normal.  Left Ear: External ear normal.  Eyes: Conjunctivae are normal. Right eye exhibits no discharge. Left eye exhibits no discharge. No scleral icterus.  Neck: Neck supple. No tracheal deviation present.  Cardiovascular: Normal rate, regular rhythm and normal heart sounds.   Pulmonary/Chest: Effort normal and breath sounds normal. No stridor. No respiratory distress. She has no wheezes. She exhibits no tenderness.  Abdominal: She exhibits no distension. There is no tenderness.  There is no guarding.  Patient has a drain protruding from her lower abdomen, can fluid is draining from the drain, no purulence or erythema surrounding her wound; she and another drain on the right buttock region draining more pink opaque fluid, no erythema or drainage around the wound  Musculoskeletal: She exhibits no edema.  Neurological: She is alert. Cranial nerve deficit: no gross deficits.  Skin: Skin is warm and dry. No rash noted.  Psychiatric: She has a normal mood and affect.  Nursing note  and vitals reviewed.    ED Treatments / Results  Labs (all labs ordered are listed, but only abnormal results are displayed) Labs Reviewed  CBC - Abnormal; Notable for the following:       Result Value   Hemoglobin 10.9 (*)    HCT 34.4 (*)    Platelets 431 (*)    All other components within normal limits  BASIC METABOLIC PANEL - Abnormal; Notable for the following:    Glucose, Bld 119 (*)    All other components within normal limits    Procedures Procedures (including critical care time)  Medications Ordered in ED Medications - No data to display   Initial Impression / Assessment and Plan / ED Course  I have reviewed the triage vital signs and the nursing notes.  Pertinent labs & imaging results that were available during my care of the patient were reviewed by me and considered in my medical decision making (see chart for details).   patient strain seemed to be functioning properly. She does not seem to be in any distress. The patient is concerned because home health did not come to the house. We will go ahead and redress her wounds while she is here. Her labs are reassuring. We will also see if we can flush her drains as requested. I spoke with Dr. Dwain Sarna. The patient can follow-up in the surgical office on Tuesday.   I can place another home health order but most likely were having some difficulties because of the holiday weekend.   Final Clinical Impressions(s) / ED Diagnoses   Final diagnoses:  Visit for wound check      Linwood Dibbles, MD 06/04/16 [phone]

## 2016-06-06 ENCOUNTER — Other Ambulatory Visit: Payer: Self-pay | Admitting: Surgery

## 2016-06-06 DIAGNOSIS — R1031 Right lower quadrant pain: Secondary | ICD-10-CM

## 2016-06-06 DIAGNOSIS — K3533 Acute appendicitis with perforation and localized peritonitis, with abscess: Secondary | ICD-10-CM

## 2016-06-07 ENCOUNTER — Other Ambulatory Visit: Payer: Self-pay | Admitting: Surgery

## 2016-06-07 ENCOUNTER — Ambulatory Visit
Admission: RE | Admit: 2016-06-07 | Discharge: 2016-06-07 | Disposition: A | Discharge status: Home / Self Care | Payer: BLUE CROSS/BLUE SHIELD | Source: Ambulatory Visit | Attending: Surgery | Admitting: Surgery

## 2016-06-07 ENCOUNTER — Ambulatory Visit
Admission: RE | Admit: 2016-06-07 | Discharge: 2016-06-07 | Disposition: A | Discharge status: Home / Self Care | Payer: BLUE CROSS/BLUE SHIELD | Source: Ambulatory Visit | Attending: Radiology | Admitting: Radiology

## 2016-06-07 DIAGNOSIS — R1031 Right lower quadrant pain: Secondary | ICD-10-CM

## 2016-06-07 DIAGNOSIS — K3533 Acute appendicitis with perforation and localized peritonitis, with abscess: Secondary | ICD-10-CM

## 2016-06-07 HISTORY — PX: IR RADIOLOGIST EVAL & MGMT: IMG5224

## 2016-06-07 MED ORDER — IOPAMIDOL (ISOVUE-300) INJECTION 61%
100.0000 mL | Freq: Once | INTRAVENOUS | Status: AC | PRN
  Administered 2016-06-07: 100 mL via INTRAVENOUS

## 2016-06-07 MED FILL — MONOJECT 0.9% NA CL SYRING: 0.9 | 14 days supply | Qty: 140 | Fill #0

## 2016-06-07 NOTE — Progress Notes (Signed)
Chief Complaint: F/U drain  Referring Physician(s): Bruning,Kevin  Supervising Physician: Ruel FavorsShick, Trevor  History of Present Illness: Glenda Wilcox is a 46 y.o. female who was admitted on 05/22/2016 with right lower quadrant abdominal pain, diarrhea, nausea, vomiting and low-grade fever.   Laboratory studies showed leukocytosis .  CT abdomen/ pelvis revealed:   Marked inflammatory process in the right lower quadrant which  is likely related to appendicitis with rupture and 9 x 6 x 6.5 cm abscess displacing adjacent terminal ileum and cecum (which are secondarily irritated). Inflammatory process immediately adjacent to right iliac vessels without significant compression or thrombosis.  She underwent a CT-guided drain placement x 2 to treat the right lower quadrant abd/pelvic abscess which was done by Dr Deanne CofferHassell on 5/15/018.  She is here today for drain clinic follow up.  She reports almost no output from the gluteal drain and minimal from the abdominal drain.  She has not recorded her output.  She is flushing the drains as instructed.  She denies fever/chills.   No past medical history on file.  Past Surgical History:  Procedure Laterality Date  . abscess surgery    . gallstone removal      Allergies: Patient has no known allergies.  Medications: Prior to Admission medications   Medication Sig Start Date End Date Taking? Authorizing Provider  acetaminophen (TYLENOL) 325 MG tablet Take 650 mg by mouth every 6 (six) hours as needed for mild pain.    [provider]  cefpodoxime (VANTIN) 200 MG tablet Take 1 tablet (200 mg total) by mouth every 12 (twelve) hours. 05/30/16   Edson SnowballMiller, Brooke A, PA-C  HYDROcodone-acetaminophen (NORCO/VICODIN) 5-325 MG tablet Take 1-2 tablets by mouth every 6 (six) hours as needed for moderate pain. Patient not taking: Reported on 06/04/2016 05/30/16   Evangeline GulaMiller, Brooke A, PA-C  ibuprofen (ADVIL,MOTRIN) 200 MG tablet Take 200 mg by  mouth every 6 (six) hours as needed.    [provider]  metroNIDAZOLE (FLAGYL) 500 MG tablet Take 1 tablet (500 mg total) by mouth every 8 (eight) hours. 05/30/16   Edson SnowballMiller, Brooke A, PA-C  saccharomyces boulardii (FLORASTOR) 250 MG capsule Take 1 capsule (250 mg total) by mouth 2 (two) times daily. You can find a probiotic over the counter. 05/30/16   Edson SnowballMiller, Brooke A, PA-C     No family history on file.  Social History   Social History  . Marital status: Married    Spouse name: N/A  . Number of children: N/A  . Years of education: N/A   Social History Main Topics  . Smoking status: Never Smoker  . Smokeless tobacco: Never Used  . Alcohol use No  . Drug use: Unknown  . Sexual activity: Not on file   Other Topics Concern  . Not on file   Social History Narrative   [place]   Review of Systems  Vital Signs: BP 91/62   Pulse 90   Temp 98.6 F (37 C) (Oral)   SpO2 100%   Physical Exam Awake and alert NAD Abdomen soft, NTND Drains in RLQ and transgluteal  Imaging: Ct Abdomen Pelvis W Contrast  Result Date: 06/07/2016 CLINICAL DATA:  Perforated appendicitis, status post right lower quadrant and right trans gluteal pelvic percutaneous drains for 2 abscesses EXAM: CT ABDOMEN AND PELVIS WITH CONTRAST TECHNIQUE: Multidetector CT imaging of the abdomen and pelvis was performed using the standard protocol following bolus administration of intravenous contrast. CONTRAST:  100mL ISOVUE-300 IOPAMIDOL (ISOVUE-300) INJECTION  61% COMPARISON:  05/30/2016, 05/29/2016, 05/22/2016 FINDINGS: Lower chest: Minimal dependent basilar atelectasis. No lower lobe pneumonia. No pericardial or pleural effusion. Normal heart size. Hepatobiliary: Previous cholecystectomy. No focal hepatic abnormality or biliary dilatation. Focal fatty infiltration of the liver along the falciform ligament, image 23. Hepatic and portal veins are patent. Pancreas: Unremarkable. No pancreatic  ductal dilatation or surrounding inflammatory changes. Spleen: Normal in size without focal abnormality. Adrenals/Urinary Tract: Adrenal glands are unremarkable. Kidneys are normal, without renal calculi, focal lesion, or hydronephrosis. Bladder is unremarkable. Stomach/Bowel: Negative for bowel obstruction, significant dilatation, ileus, or free air. Right lower quadrant percutaneous drain is stable in position at the previous abscess site from the perforated appendicitis. Trace amount of residual fluid and air at the drain measures 2.8 cm, image 59. Right trans gluteal pelvic drain is also stable in position in the midline pelvis. Previous pelvic collection has resolved. No new abscess. Vascular/Lymphatic: No significant vascular findings are present. No enlarged abdominal or pelvic lymph nodes. Reproductive: Uterus and bilateral adnexa are unremarkable. Other: No abdominal wall hernia or abnormality. No abdominopelvic ascites. Musculoskeletal: No acute osseous finding IMPRESSION: Resolved posterior pelvic abscess following right trans gluteal percutaneous drain. Small residual abscess at the right lower quadrant percutaneous drain measuring 2.8 cm. Residual right lower quadrant and right pelvic sidewall inflammation related to the perforated appendicitis. No new fluid collection Electronically Signed   By: Judie Petit.  Shick M.D.   On: 06/07/2016 13:21   Ct Abdomen Pelvis W Contrast  Result Date: 05/29/2016 CLINICAL DATA:  Follow-up appendiceal abscess status post drainage. EXAM: CT ABDOMEN AND PELVIS WITH CONTRAST TECHNIQUE: Multidetector CT imaging of the abdomen and pelvis was performed using the standard protocol following bolus administration of intravenous contrast. CONTRAST:  ISOVUE-300 IOPAMIDOL (ISOVUE-300) INJECTION 61% COMPARISON:  05/22/2016 FINDINGS: Lower chest:  No acute finding Hepatobiliary: Visualized portions are negative.No evidence of biliary obstruction or stone. Pancreas: Visualized  portions are negative. Spleen: Unremarkable. Adrenals/Urinary Tract: Negative adrenals. No hydronephrosis or stone. 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 Unremarkable bladder. Stomach/Bowel: Appendix seen extending medially from the cecal base, with broad area nonenhancing wall along the inferior margin. There is a right lower quadrant drain which is in stable position from placement CT. Significant improvement in the associated abscess, now only with few gas bubbles. Discrete 22 x 16 mm collection inferior to the cecum, which may not communicate with the drained abscess. There is a new rim enhancing collection in the rectovaginal recess measuring 7 cm. Vascular/Lymphatic: No acute vascular abnormality. No mass or adenopathy. Reproductive:Interval development of bilateral hydrosalpinx that is mild, likely due to obstruction by pelvic abscess. Other: Collections as described above.  No free pneumoperitoneum. Musculoskeletal: No acute abnormalities. IMPRESSION: 1. New 7 cm collection in the rectovaginal recess. 2. New 16 x 22 mm collection in the low right peritoneum. 3. Successful drainage of the right lower quadrant abscess, with no residual fluid around the catheter. 4. Broad necrosis along the base and mid portion of the appendix. 5. Interval bilateral hydrosalpinx. Electronically Signed   By: Marnee Spring M.D.   On: 05/29/2016 14:29   Ct Abdomen Pelvis W Contrast  Result Date: 05/22/2016 CLINICAL DATA:  46 year old female with right lower quadrant pain, nausea, vomiting and diarrhea for 3 weeks. Prior gallbladder surgery. Initial encounter. EXAM: CT ABDOMEN AND PELVIS WITH CONTRAST TECHNIQUE: Multidetector CT imaging of the abdomen and pelvis was performed using the standard protocol following bolus administration of intravenous contrast. CONTRAST:  ISOVUE-300 IOPAMIDOL (ISOVUE-300) INJECTION 61%  COMPARISON:  None. FINDINGS: Lower chest: Minimal atelectasis. Heart size within normal limits. Prominent  dense breast parenchyma with coarse calcifications. Hepatobiliary: No worrisome hepatic lesion. Very mild fatty infiltration. Minimally lobular contour without other findings of cirrhosis. Post cholecystectomy. Pancreas: No mass or inflammation. Spleen: No mass or enlargement. Adrenals/Urinary Tract: No renal or ureteral obstructing stone or hydronephrosis. Tiny low-density structure right kidney may represent a tiny angiomyelolipoma. No adrenal mass. Decompressed urinary bladder without gross abnormality. Stomach/Bowel: Marked inflammatory process in the right lower quadrant which I suspect is related to appendicitis with rupture and 9 x 6 x 6.5 cm abscess displacing adjacent terminal ileum and cecum (which are secondarily irritated). Other causes such as Meckel's diverticulum or tumor felt to be much less likely. Vascular/Lymphatic: No abdominal aortic aneurysm or large vessel occlusion. Inflammatory process immediately adjacent to right iliac vessels without significant compression or thrombosis. Lymph nodes within small bowel mesentery probably reactive in origin. Reproductive: No worrisome primary adnexal or uterine abnormality. Other: Gas within abscess but without surrounding free intraperitoneal air otherwise noted. Musculoskeletal: No worrisome osseous abnormality. IMPRESSION: Marked inflammatory process in the right lower quadrant which I suspect is related to appendicitis with rupture and 9 x 6 x 6.5 cm abscess displacing adjacent terminal ileum and cecum (which are secondarily irritated). Inflammatory process immediately adjacent to right iliac vessels without significant compression or thrombosis. These results were called by telephone at the time of interpretation on 05/22/2016 at 6:36 pm to Dr. Meredith Staggers , who verbally acknowledged these results. Electronically Signed   By: Lacy Duverney M.D.   On: 05/22/2016 18:59   Ct Image Guided Drainage By Percutaneous Catheter  Result Date:  05/30/2016 INDICATION: 46 year old female with history of perforated appendicitis. She had a drain placed into the abscess in the right lower quadrant on 05/23/2016. She has had persistent pelvic pain and fevers. Repeat CT imaging on 05/29/2016 demonstrates a new abscess in the pelvic cul-de-sac. She presents for trans gluteal drain placement. EXAM: CT GUIDED DRAINAGE OF  ABSCESS MEDICATIONS: The patient is currently admitted to the hospital and receiving intravenous antibiotics. The antibiotics were administered within an appropriate time frame prior to the initiation of the procedure. ANESTHESIA/SEDATION: 2 mg IV Versed 100 mcg IV Fentanyl Moderate Sedation Time:  21 minutes The patient was continuously monitored during the procedure by the interventional radiology nurse under my direct supervision. COMPLICATIONS: None immediate. TECHNIQUE: Informed written consent was obtained from the patient after a thorough discussion of the procedural risks, benefits and alternatives. All questions were addressed. A timeout was performed prior to the initiation of the procedure. PROCEDURE: The operative field was prepped with Chlorhexidine in a sterile fashion, and a sterile drape was applied covering the operative field. A sterile gown and sterile gloves were used for the procedure. Local anesthesia was provided with 1% Lidocaine. A planning axial CT scan was performed. The pelvic cul-de-sac complex fluid collection was successfully identified. A suitable skin entry site was selected and marked. Local anesthesia was attained by infiltration with 1% lidocaine. A small dermatotomy was made. Under intermittent CT guidance, a 18 gauge introducer needle was carefully advanced adjacent to the sacrum and into the pelvic fluid collection. A short Amplatz wire was then coiled within the fluid collection. The needle was removed. The skin tract was dilated to 10 [place] and a Cook 10.2 [place] all-purpose drainage catheter was advanced  over the wire and formed in the abscess collection. Aspiration yields approximately 80 mL of thick purulent fluid. The  abscess cavity was lavaged with 30 cc of sterile saline. The drainage catheter was then connected to JP bulb suction and secured to the skin with 0 Prolene suture. Post placement CT imaging demonstrates a well-positioned drainage catheter and no significant residual fluid. The patient tolerated the procedure well. FINDINGS: 80 mL frankly purulent material in the pelvic cul-de-sac abscess IMPRESSION: Successful placement of a right trans gluteal approach 10 French drainage catheter into the abscess in the pelvic cul-de-sac. Electronically Signed   By: Malachy Moan M.D.   On: 05/30/2016 17:44   Ct Image Guided Drainage By Percutaneous Catheter  Result Date: 05/23/2016 CLINICAL DATA:  Appendiceal abscess. EXAM: CT GUIDED DRAINAGE OF PELVIC ABSCESS ANESTHESIA/SEDATION: Intravenous Fentanyl and Versed were administered as conscious sedation during continuous monitoring of the patient's level of consciousness and physiological / cardiorespiratory status by the radiology RN, with a total moderate sedation time of 10 minutes. PROCEDURE: The procedure, risks, benefits, and alternatives were explained to the patient. Questions regarding the procedure were encouraged and answered. The patient understands and consents to the procedure. Patient placed supine. Select axial scans through the abdomen obtained. The collection was localized and the skin entry site was determined and marked. The operative field was prepped with chlorhexidinein a sterile fashion, and a sterile drape was applied covering the operative field. A sterile gown and sterile gloves were used for the procedure. Local anesthesia was provided with 1% Lidocaine. Under CT fluoroscopic guidance, an 18 gauge trocar needle was advanced into the collection. Purulent material could be aspirated. An Amplatz guidewire advanced easily within the  collection, position confirmed on CT. Tract dilated to facilitate placement of a 12 French pigtail drain catheter, placed centrally within the collection, confirmed on CT. 30 mL of purulent aspirate were sent for Gram stain and culture. Catheter secured externally with 0 Prolene suture and StatLock and placed to gravity bag. The patient tolerated the procedure well. COMPLICATIONS: None immediate FINDINGS: Complex right lower quadrant pelvic abscess was again localized. Twelve French drain catheter placed under CT guidance as above. IMPRESSION: 1. Technically successful CT-guided right lower quadrant pelvic abscess drain catheter placement. A sample of the aspirate was sent for Gram stain and culture. Electronically Signed   By: Corlis Leak M.D.   On: 05/23/2016 17:16    Labs:  CBC:  Recent Labs  05/27/16 0510 05/29/16 0429 05/30/16 0355 06/04/16 1706  WBC 14.2* 9.2 9.5 6.2  HGB 9.2* 10.2* 10.5* 10.9*  HCT 27.8* 32.0* 33.0* 34.4*  PLT 391 476* 528* 431*    COAGS:  Recent Labs  05/23/16 1049  INR 1.31    BMP:  Recent Labs  05/26/16 0444 05/27/16 0510 05/30/16 0355 06/04/16 1706  NA 137 138 135 141  K 3.9 4.0 3.8 4.0  CL 104 104 102 107  CO2 25 25 26 29   GLUCOSE 91 101* 116* 119*  BUN <5* <5* 6 9  CALCIUM 8.4* 8.3* 8.9 9.0  CREATININE 0.65 0.44 0.54 0.63  GFRNONAA >60 >60 >60 >60  GFRAA >60 >60 >60 >60    LIVER FUNCTION TESTS:  Recent Labs  05/22/16 1956 05/23/16 0449 05/30/16 0355  BILITOT 0.9 1.2 0.3  AST 21 18 50*  ALT 20 18 56*  ALKPHOS 113 109 133*  PROT 7.8 7.5 7.7  ALBUMIN 3.1* 2.9* 2.7*    TUMOR MARKERS: No results for input(s): AFPTM, CEA, CA199, CHROMGRNA in the last 8760 hours.  Assessment:  S/P CT guided drain placement on 05/23/2016 by Dr. Deanne Coffer  CT reviewed and drains injected by Dr. Miles Costain today.  Will leave both drains in place for now and change to gravity bags.  Return in 2 weeks with repeat CT and drain  injections.   Electronically Signed: Gwynneth Macleod PA-C 06/07/2016, 2:42 PM   Please refer to Dr. Chester Holstein attestation of this note for management and plan.

## 2016-06-20 ENCOUNTER — Ambulatory Visit
Admission: RE | Admit: 2016-06-20 | Discharge: 2016-06-20 | Disposition: A | Discharge status: Home / Self Care | Payer: BLUE CROSS/BLUE SHIELD | Source: Ambulatory Visit | Attending: Surgery | Admitting: Surgery

## 2016-06-20 DIAGNOSIS — K3533 Acute appendicitis with perforation and localized peritonitis, with abscess: Secondary | ICD-10-CM

## 2016-06-20 DIAGNOSIS — R1031 Right lower quadrant pain: Secondary | ICD-10-CM

## 2016-06-20 HISTORY — PX: IR RADIOLOGIST EVAL & MGMT: IMG5224

## 2016-06-20 MED ORDER — IOPAMIDOL (ISOVUE-300) INJECTION 61%
100.0000 mL | Freq: Once | INTRAVENOUS | Status: AC | PRN
  Administered 2016-06-20: 100 mL via INTRAVENOUS

## 2016-06-20 NOTE — Consult Note (Signed)
Chief Complaint: Patient was seen in consultation today for follow-up of percutaneous abscess drains at the request of Gerkin,Todd  Referring Physician(s): Gerkin,Todd  History of Present Illness: Glenda Wilcox is a 46 y.o. female with history of a perforated appendicitis. She has undergone percutaneous drainage of the right lower quadrant abscess and a pelvic abscess. Patient presents for follow-up imaging and evaluation. Patient reports that she visited the surgeon's office yesterday but she is not a very good historian and it is difficult to know what was talked about. She has done a good job of recording the drain output. There is approximately 10-15 mL output from the right lower quadrant drain per day and just drops of output from the pelvic drain. Patient denies fevers or chills. No problems with bowel movements or urination. Patient has no abdominal pain.  No past medical history on file.  Past Surgical History:  Procedure Laterality Date  . abscess surgery    . gallstone removal      Allergies: Patient has no known allergies.  Medications: Prior to Admission medications   Medication Sig Start Date End Date Taking? Authorizing Provider  acetaminophen (TYLENOL) 325 MG tablet Take 650 mg by mouth every 6 (six) hours as needed for mild pain.    [provider]  cefpodoxime (VANTIN) 200 MG tablet Take 1 tablet (200 mg total) by mouth every 12 (twelve) hours. 05/30/16   Edson Snowball, PA-C  HYDROcodone-acetaminophen (NORCO/VICODIN) 5-325 MG tablet Take 1-2 tablets by mouth every 6 (six) hours as needed for moderate pain. Patient not taking: Reported on 06/04/2016 05/30/16   Evangeline Gula A, PA-C  ibuprofen (ADVIL,MOTRIN) 200 MG tablet Take 200 mg by mouth every 6 (six) hours as needed.    [provider]  metroNIDAZOLE (FLAGYL) 500 MG tablet Take 1 tablet (500 mg total) by mouth every 8 (eight) hours. 05/30/16   Edson Snowball, PA-C  saccharomyces boulardii  (FLORASTOR) 250 MG capsule Take 1 capsule (250 mg total) by mouth 2 (two) times daily. You can find a probiotic over the counter. 05/30/16   Edson Snowball, PA-C     No family history on file.    Review of Systems  Constitutional: Negative for chills and fever.  Gastrointestinal: Negative for abdominal distention and abdominal pain.  Genitourinary: Negative.     Vital Signs: BP 116/75   Pulse 84   Temp 98.4 F (36.9 C)   SpO2 100%   Physical Exam  Abdominal: Soft. She exhibits no distension. There is no tenderness.  RLQ and pelvic drains were intact. No erythema around drains.         Imaging: Ct Pelvis W Contrast  Result Date: 06/20/2016 CLINICAL DATA:  46 year old with history of perforated appendicitis. Patient has 2 percutaneous drainage catheters. Patient reports minimal drainage from both catheters. Patient is asymptomatic. EXAM: CT PELVIS WITH CONTRAST TECHNIQUE: Multidetector CT imaging of the pelvis was performed using the standard protocol following the bolus administration of intravenous contrast. CONTRAST:  ISOVUE-300 IOPAMIDOL (ISOVUE-300) INJECTION 61% COMPARISON:  06/07/2016 and drain injection 06/07/2016 FINDINGS: Urinary Tract:  Urinary bladder is decompressed. Bowel: No evidence for bowel dilatation or obstruction. Mild inflammatory changes in the right lower quadrant adjacent to the percutaneous drainage catheter. Mild asymmetric wall thickening in the terminal ileum. Appendix is not confidently identified. Vascular/Lymphatic: Normal appearance of the vascular structures. Few prominent central mesenteric lymph nodes are likely reactive. Reproductive:  Uterus and adnexal structures are unremarkable. Other: Again noted is a  percutaneous drain in the right lower quadrant between loops of bowel. There is no significant air-fluid collection around the drain. Again noted is a right transgluteal drain which is positioned just posterior to the uterus. There is no  significant fluid or gas surrounding this catheter. No new abscess collections. No evidence for free fluid. Mild wall thickening along the right abdominal oblique musculature associated with the drainage catheter and similar to the previous examination. Musculoskeletal: No suspicious bone lesions identified. IMPRESSION: Stable position of the percutaneous drainage catheters. No residual fluid or abscess collections in the pelvis. Persistent mild inflammatory changes in the right lower quadrant. Electronically Signed   By: Richarda Overlie M.D.   On: 06/20/2016 09:37   Ct Abdomen Pelvis W Contrast  Result Date: 06/07/2016 CLINICAL DATA:  Perforated appendicitis, status post right lower quadrant and right trans gluteal pelvic percutaneous drains for 2 abscesses EXAM: CT ABDOMEN AND PELVIS WITH CONTRAST TECHNIQUE: Multidetector CT imaging of the abdomen and pelvis was performed using the standard protocol following bolus administration of intravenous contrast. CONTRAST:  ISOVUE-300 IOPAMIDOL (ISOVUE-300) INJECTION 61% COMPARISON:  05/30/2016, 05/29/2016, 05/22/2016 FINDINGS: Lower chest: Minimal dependent basilar atelectasis. No lower lobe pneumonia. No pericardial or pleural effusion. Normal heart size. Hepatobiliary: Previous cholecystectomy. No focal hepatic abnormality or biliary dilatation. Focal fatty infiltration of the liver along the falciform ligament, image 23. Hepatic and portal veins are patent. Pancreas: Unremarkable. No pancreatic ductal dilatation or surrounding inflammatory changes. Spleen: Normal in size without focal abnormality. Adrenals/Urinary Tract: Adrenal glands are unremarkable. Kidneys are normal, without renal calculi, focal lesion, or hydronephrosis. Bladder is unremarkable. Stomach/Bowel: Negative for bowel obstruction, significant dilatation, ileus, or free air. Right lower quadrant percutaneous drain is stable in position at the previous abscess site from the perforated appendicitis.  Trace amount of residual fluid and air at the drain measures 2.8 cm, image 59. Right trans gluteal pelvic drain is also stable in position in the midline pelvis. Previous pelvic collection has resolved. No new abscess. Vascular/Lymphatic: No significant vascular findings are present. No enlarged abdominal or pelvic lymph nodes. Reproductive: Uterus and bilateral adnexa are unremarkable. Other: No abdominal wall hernia or abnormality. No abdominopelvic ascites. Musculoskeletal: No acute osseous finding IMPRESSION: Resolved posterior pelvic abscess following right trans gluteal percutaneous drain. Small residual abscess at the right lower quadrant percutaneous drain measuring 2.8 cm. Residual right lower quadrant and right pelvic sidewall inflammation related to the perforated appendicitis. No new fluid collection Electronically Signed   By: Judie Petit.  Shick M.D.   On: 06/07/2016 13:21   Ct Abdomen Pelvis W Contrast  Result Date: 05/29/2016 CLINICAL DATA:  Follow-up appendiceal abscess status post drainage. EXAM: CT ABDOMEN AND PELVIS WITH CONTRAST TECHNIQUE: Multidetector CT imaging of the abdomen and pelvis was performed using the standard protocol following bolus administration of intravenous contrast. CONTRAST:  ISOVUE-300 IOPAMIDOL (ISOVUE-300) INJECTION 61% COMPARISON:  05/22/2016 FINDINGS: Lower chest:  No acute finding Hepatobiliary: Visualized portions are negative.No evidence of biliary obstruction or stone. Pancreas: Visualized portions are negative. Spleen: Unremarkable. Adrenals/Urinary Tract: Negative adrenals. No hydronephrosis or stone. 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 Unremarkable bladder. Stomach/Bowel: Appendix seen extending medially from the cecal base, with broad area nonenhancing wall along the inferior margin. There is a right lower quadrant drain which is in stable position from placement CT. Significant improvement in the associated abscess, now only with few gas bubbles. Discrete 22 x 16  mm collection inferior to the cecum, which may not communicate with the drained abscess. There  is a new rim enhancing collection in the rectovaginal recess measuring 7 cm. Vascular/Lymphatic: No acute vascular abnormality. No mass or adenopathy. Reproductive:Interval development of bilateral hydrosalpinx that is mild, likely due to obstruction by pelvic abscess. Other: Collections as described above.  No free pneumoperitoneum. Musculoskeletal: No acute abnormalities. IMPRESSION: 1. New 7 cm collection in the rectovaginal recess. 2. New 16 x 22 mm collection in the low right peritoneum. 3. Successful drainage of the right lower quadrant abscess, with no residual fluid around the catheter. 4. Broad necrosis along the base and mid portion of the appendix. 5. Interval bilateral hydrosalpinx. Electronically Signed   By: Marnee SpringJonathon  Watts M.D.   On: 05/29/2016 14:29   Ct Abdomen Pelvis W Contrast  Result Date: 05/22/2016 CLINICAL DATA:  46 year old female with right lower quadrant pain, nausea, vomiting and diarrhea for 3 weeks. Prior gallbladder surgery. Initial encounter. EXAM: CT ABDOMEN AND PELVIS WITH CONTRAST TECHNIQUE: Multidetector CT imaging of the abdomen and pelvis was performed using the standard protocol following bolus administration of intravenous contrast. CONTRAST:  100mL ISOVUE-300 IOPAMIDOL (ISOVUE-300) INJECTION 61% COMPARISON:  None. FINDINGS: Lower chest: Minimal atelectasis. Heart size within normal limits. Prominent dense breast parenchyma with coarse calcifications. Hepatobiliary: No worrisome hepatic lesion. Very mild fatty infiltration. Minimally lobular contour without other findings of cirrhosis. Post cholecystectomy. Pancreas: No mass or inflammation. Spleen: No mass or enlargement. Adrenals/Urinary Tract: No renal or ureteral obstructing stone or hydronephrosis. Tiny low-density structure right kidney may represent a tiny angiomyelolipoma. No adrenal mass. Decompressed urinary bladder  without gross abnormality. Stomach/Bowel: Marked inflammatory process in the right lower quadrant which I suspect is related to appendicitis with rupture and 9 x 6 x 6.5 cm abscess displacing adjacent terminal ileum and cecum (which are secondarily irritated). Other causes such as Meckel's diverticulum or tumor felt to be much less likely. Vascular/Lymphatic: No abdominal aortic aneurysm or large vessel occlusion. Inflammatory process immediately adjacent to right iliac vessels without significant compression or thrombosis. Lymph nodes within small bowel mesentery probably reactive in origin. Reproductive: No worrisome primary adnexal or uterine abnormality. Other: Gas within abscess but without surrounding free intraperitoneal air otherwise noted. Musculoskeletal: No worrisome osseous abnormality. IMPRESSION: Marked inflammatory process in the right lower quadrant which I suspect is related to appendicitis with rupture and 9 x 6 x 6.5 cm abscess displacing adjacent terminal ileum and cecum (which are secondarily irritated). Inflammatory process immediately adjacent to right iliac vessels without significant compression or thrombosis. These results were called by telephone at the time of interpretation on 05/22/2016 at 6:36 pm to Dr. Meredith StaggersJEFFREY GREENE , who verbally acknowledged these results. Electronically Signed   By: Lacy DuverneySteven  Olson M.D.   On: 05/22/2016 18:59   Dg Sinus/fist Tube Chk-non Gi  Result Date: 06/07/2016 INDICATION: Perforated appendicitis with 2 abscess drains EXAM: Fluoroscopic injection of the right lower quadrant and right trans gluteal abscess drains MEDICATIONS: None. ANESTHESIA/SEDATION: None. COMPLICATIONS: None immediate. PROCEDURE: Informed written consent was obtained from the patient after a thorough discussion of the procedural risks, benefits and alternatives. All questions were addressed. Maximal Sterile Barrier Technique was utilized including caps, mask, sterile gowns, sterile gloves,  sterile drape, hand hygiene and skin antiseptic. A timeout was performed prior to the initiation of the procedure. Right lower quadrant drain injection: Under sterile conditions, the existing right lower quadrant abscess drain was injected. This demonstrates an irregular collapsed abscess cavity with contrast tracking along the pericolonic fat. No definite communication to peristalsing bowel. Contrast was easily aspirated. Persistent purulent  drainage noted. Right trans gluteal pelvic drain injection: Under sterile conditions, the trans gluteal pelvic drain was injected. This demonstrates a collapsed abscess cavity but fistula communication to bilateral hydrosalpinx. No definite connection to small bowel or colon. IMPRESSION: Right lower quadrant abscess cavity is collapsed without evidence of fistula to bowel. Right trans gluteal pelvic abscess cavity is also collapsed but there is fistula communication to bilateral hydrosalpinx when correlating with the prior CTs. Electronically Signed   By: Judie Petit.  Shick M.D.   On: 06/07/2016 15:19   Ct Image Guided Drainage By Percutaneous Catheter  Result Date: 05/30/2016 INDICATION: 46 year old female with history of perforated appendicitis. She had a drain placed into the abscess in the right lower quadrant on 05/23/2016. She has had persistent pelvic pain and fevers. Repeat CT imaging on 05/29/2016 demonstrates a new abscess in the pelvic cul-de-sac. She presents for trans gluteal drain placement. EXAM: CT GUIDED DRAINAGE OF  ABSCESS MEDICATIONS: The patient is currently admitted to the hospital and receiving intravenous antibiotics. The antibiotics were administered within an appropriate time frame prior to the initiation of the procedure. ANESTHESIA/SEDATION: 2 mg IV Versed 100 mcg IV Fentanyl Moderate Sedation Time:  21 minutes The patient was continuously monitored during the procedure by the interventional radiology nurse under my direct supervision. COMPLICATIONS: None  immediate. TECHNIQUE: Informed written consent was obtained from the patient after a thorough discussion of the procedural risks, benefits and alternatives. All questions were addressed. A timeout was performed prior to the initiation of the procedure. PROCEDURE: The operative field was prepped with Chlorhexidine in a sterile fashion, and a sterile drape was applied covering the operative field. A sterile gown and sterile gloves were used for the procedure. Local anesthesia was provided with 1% Lidocaine. A planning axial CT scan was performed. The pelvic cul-de-sac complex fluid collection was successfully identified. A suitable skin entry site was selected and marked. Local anesthesia was attained by infiltration with 1% lidocaine. A small dermatotomy was made. Under intermittent CT guidance, a 18 gauge introducer needle was carefully advanced adjacent to the sacrum and into the pelvic fluid collection. A short Amplatz wire was then coiled within the fluid collection. The needle was removed. The skin tract was dilated to 10 [place] and a Cook 10.2 [place] all-purpose drainage catheter was advanced over the wire and formed in the abscess collection. Aspiration yields approximately 80 mL of thick purulent fluid. The abscess cavity was lavaged with 30 cc of sterile saline. The drainage catheter was then connected to JP bulb suction and secured to the skin with 0 Prolene suture. Post placement CT imaging demonstrates a well-positioned drainage catheter and no significant residual fluid. The patient tolerated the procedure well. FINDINGS: 80 mL frankly purulent material in the pelvic cul-de-sac abscess IMPRESSION: Successful placement of a right trans gluteal approach 10 French drainage catheter into the abscess in the pelvic cul-de-sac. Electronically Signed   By: Malachy Moan M.D.   On: 05/30/2016 17:44   Ct Image Guided Drainage By Percutaneous Catheter  Result Date: 05/23/2016 CLINICAL DATA:  Appendiceal  abscess. EXAM: CT GUIDED DRAINAGE OF PELVIC ABSCESS ANESTHESIA/SEDATION: Intravenous Fentanyl and Versed were administered as conscious sedation during continuous monitoring of the patient's level of consciousness and physiological / cardiorespiratory status by the radiology RN, with a total moderate sedation time of 10 minutes. PROCEDURE: The procedure, risks, benefits, and alternatives were explained to the patient. Questions regarding the procedure were encouraged and answered. The patient understands and consents to the procedure. Patient placed  supine. Select axial scans through the abdomen obtained. The collection was localized and the skin entry site was determined and marked. The operative field was prepped with chlorhexidinein a sterile fashion, and a sterile drape was applied covering the operative field. A sterile gown and sterile gloves were used for the procedure. Local anesthesia was provided with 1% Lidocaine. Under CT fluoroscopic guidance, an 18 gauge trocar needle was advanced into the collection. Purulent material could be aspirated. An Amplatz guidewire advanced easily within the collection, position confirmed on CT. Tract dilated to facilitate placement of a 12 French pigtail drain catheter, placed centrally within the collection, confirmed on CT. 30 mL of purulent aspirate were sent for Gram stain and culture. Catheter secured externally with 0 Prolene suture and StatLock and placed to gravity bag. The patient tolerated the procedure well. COMPLICATIONS: None immediate FINDINGS: Complex right lower quadrant pelvic abscess was again localized. Twelve French drain catheter placed under CT guidance as above. IMPRESSION: 1. Technically successful CT-guided right lower quadrant pelvic abscess drain catheter placement. A sample of the aspirate was sent for Gram stain and culture. Electronically Signed   By: Corlis Leak M.D.   On: 05/23/2016 17:16    Labs:  CBC:  Recent Labs  05/27/16 0510  05/29/16 0429 05/30/16 0355 06/04/16 1706  WBC 14.2* 9.2 9.5 6.2  HGB 9.2* 10.2* 10.5* 10.9*  HCT 27.8* 32.0* 33.0* 34.4*  PLT 391 476* 528* 431*    COAGS:  Recent Labs  05/23/16 1049  INR 1.31    BMP:  Recent Labs  05/26/16 0444 05/27/16 0510 05/30/16 0355 06/04/16 1706  NA 137 138 135 141  K 3.9 4.0 3.8 4.0  CL 104 104 102 107  CO2 25 25 26 29   GLUCOSE 91 101* 116* 119*  BUN <5* <5* 6 9  CALCIUM 8.4* 8.3* 8.9 9.0  CREATININE 0.65 0.44 0.54 0.63  GFRNONAA >60 >60 >60 >60  GFRAA >60 >60 >60 >60    LIVER FUNCTION TESTS:  Recent Labs  05/22/16 1956 05/23/16 0449 05/30/16 0355  BILITOT 0.9 1.2 0.3  AST 21 18 50*  ALT 20 18 56*  ALKPHOS 113 109 133*  PROT 7.8 7.5 7.7  ALBUMIN 3.1* 2.9* 2.7*    TUMOR MARKERS: No results for input(s): AFPTM, CEA, CA199, CHROMGRNA in the last 8760 hours.  Assessment and Plan:  46 year old with history of perforated appendicitis. Patient developed abscesses in the right lower quadrant and pelvis. These abscess collections have resolved with percutaneous drainage. Patient is currently asymptomatic. There is no evidence for bowel fistula on the drain injections. Patient no longer needs the drains. The percutaneous tcatheters were removed without complication. No additional follow-up with IR needed at this time.  Thank you for this interesting consult.  I greatly enjoyed meeting Glenda Wilcox and look forward to participating in their care.  A copy of this report was sent to the requesting provider on this date.  Electronically Signed: Abundio Miu 06/20/2016, 9:46 AM   I spent a total of    10 Minutes in face to face in clinical consultation, greater than 50% of which was counseling/coordinating care for drain care.

## 2016-06-23 ENCOUNTER — Encounter: Payer: Self-pay | Admitting: Interventional Radiology

## 2016-06-26 ENCOUNTER — Encounter: Payer: Self-pay | Admitting: Diagnostic Radiology

## 2016-10-18 ENCOUNTER — Ambulatory Visit (INDEPENDENT_AMBULATORY_CARE_PROVIDER_SITE_OTHER): Payer: BLUE CROSS/BLUE SHIELD | Admitting: Physician Assistant

## 2016-10-18 ENCOUNTER — Encounter: Payer: Self-pay | Admitting: Physician Assistant

## 2016-10-18 VITALS — BP 110/64 | HR 84 | Temp 98.7°F | Resp 18 | Ht 67.28 in | Wt 153.0 lb

## 2016-10-18 DIAGNOSIS — R1011 Right upper quadrant pain: Secondary | ICD-10-CM

## 2016-10-18 DIAGNOSIS — R1013 Epigastric pain: Secondary | ICD-10-CM | POA: Diagnosis not present

## 2016-10-18 NOTE — Progress Notes (Signed)
Glenda Wilcox  MRN: [id number] DOB: Jun 21, 1970  Subjective:   Glenda Wilcox is a 46 y.o. female who presents for evaluation of abdominal pain. Onset was 6 days ago. The pain is intermittent and described as sharp, and is 10/10 in intensity. Pain is located in the RUQ and epigastric region with radiation to shoulder. Pain is not present right now. When it occurs, it last from anywhere from 1-3 hours. Cannot recall if it is associated with food. Aggravating factors: none.  Alleviating factors: none. Associated symptoms: some mild intermittent nausea and heartburn. The patient denies anorexia, arthralagias, belching, chills, constipation, diarrhea, dysuria, fever, flatus, frequency, headache, hematochezia, hematuria, melena, myalgias, sweats and vomiting.  Last BM was last night, it was normal. Denies excessive NSAID or alcohol use. Has PSH of gallstone removal and appendix abscess drainage.   Review of Systems  Respiratory: Negative for cough and shortness of breath.   Cardiovascular: Negative for chest pain and palpitations.  Genitourinary: Negative for difficulty urinating, dysuria, frequency, hematuria and urgency.  Neurological: Negative for dizziness and light-headedness.    Patient Active Problem List   Diagnosis Date Noted  . Right lower quadrant abdominal pain 05/22/2016  . Acute appendicitis with peritoneal abscess s/p perc drainage 05/23/2016 05/22/2016    Current Outpatient Prescriptions on File Prior to Visit  Medication Sig Dispense Refill  . acetaminophen (TYLENOL) 325 MG tablet Take 650 mg by mouth every 6 (six) hours as needed for mild pain.    Marland Kitchen ibuprofen (ADVIL,MOTRIN) 200 MG tablet Take 200 mg by mouth every 6 (six) hours as needed.    . cefpodoxime (VANTIN) 200 MG tablet Take 1 tablet (200 mg total) by mouth every 12 (twelve) hours. (Patient not taking: Reported on 10/18/2016) 14 tablet 0  . HYDROcodone-acetaminophen (NORCO/VICODIN) 5-325 MG tablet Take 1-2 tablets by mouth  every 6 (six) hours as needed for moderate pain. (Patient not taking: Reported on 06/04/2016) 20 tablet 0  . metroNIDAZOLE (FLAGYL) 500 MG tablet Take 1 tablet (500 mg total) by mouth every 8 (eight) hours. (Patient not taking: Reported on 10/18/2016) 21 tablet 0  . saccharomyces boulardii (FLORASTOR) 250 MG capsule Take 1 capsule (250 mg total) by mouth 2 (two) times daily. You can find a probiotic over the counter. (Patient not taking: Reported on 10/18/2016)     No current facility-administered medications on file prior to visit.     No Known Allergies    Social History   Social History  . Marital status: Married    Spouse name: N/A  . Number of children: N/A  . Years of education: N/A   Occupational History  . Not on file.   Social History Main Topics  . Smoking status: Never Smoker  . Smokeless tobacco: Never Used  . Alcohol use No  . Drug use: No  . Sexual activity: Not on file   Other Topics Concern  . Not on file   Social History Narrative   Originally from [place]    Objective:  BP 110/64 (BP Location: Left Arm, Patient Position: Sitting, Cuff Size: Normal)   Pulse 84   Temp 98.7 F (37.1 C) (Oral)   Resp 18   Ht 5' 7.28" (1.709 m)   Wt 153 lb (69.4 kg)   LMP 05/18/2016 (Approximate)   SpO2 99%   BMI 23.76 kg/m   Physical Exam  Constitutional: She is oriented to person, place, and time and well-developed, well-nourished, and in no distress.  HENT:  Head: Normocephalic and atraumatic.  Eyes: Conjunctivae are normal.  Neck: Normal range of motion.  Cardiovascular: Normal rate, regular rhythm and normal heart sounds.   Pulmonary/Chest: Effort normal and breath sounds normal. She has no decreased breath sounds. She has no wheezes. She has no rhonchi. She has no rales.  Abdominal: Soft. Normal appearance and bowel sounds are normal. She exhibits no distension. There is no hepatosplenomegaly. There is no tenderness. There is no rigidity, no rebound, no  guarding, no CVA tenderness, no tenderness at McBurney's point and negative Murphy's sign.  Neurological: She is alert and oriented to person, place, and time. Gait normal.  Skin: Skin is warm and dry.  Psychiatric: Affect normal.  Vitals reviewed.   Assessment and Plan :  1. RUQ pain 2. Epigastric pain Hx and symptoms concerning for gallstone disease. Pt is well appearing and vitals are stable. No acute findings on PE. STAT RUQ [place] ordered for 10am tomorrow morning. Labs pending. Pt encouraged that if her pain returns and persists or she develops fever, chills, vomiting, or other concerning symptoms, seek care immediately at the ED. - US Abdomen Limited RUQ; Future - CMP14+EGFR - Lipase - Amylase - CBC with Differential/Platelet  Glenda Delaine PA-C  [facility] 10/18/2016 4:49 PM

## 2016-10-18 NOTE — Progress Notes (Deleted)
Subjective:     Glenda Wilcox is a 46 y.o. female who presents for evaluation of abdominal pain. Onset was {numbers; 1-10:13787} {unit:19031} ago. Symptoms have been {course:17::"unchanged"}. The pain is described as {quality:19151}, and is {0-10:5044}/10 in intensity. Pain is located in the {anatomy; location abdomen:19149} {abdomen pain radiation:616}.  Aggravating factors: {aggrav factors:619}.  Alleviating factors: {allev factors:620}. Associated symptoms: {assoc symptoms:621}. The patient denies {assoc symptoms:19032}.  {Misc; AMB Common SmartLinks:21383::"The patient's history has been marked as reviewed and updated as appropriate."}  Review of Systems {ros - complete:30496}     Objective:    {Exam, Complete:17964}    Assessment:    Abdominal pain, likely secondary to *** .    Plan:    {abd pain treatment plan:14425}

## 2016-10-18 NOTE — Patient Instructions (Addendum)
You are scheduled for an ultrasound at 10am at [facility] tomorrow 10/19/16. Please arrive 15 minutes prior to your scheduled visit to the main entrance of the hospital.    We should have your lab work back in a few days. In the meantime, please avoid any NSAID medication. I recommend eating a bland diet until we have a better idea of what is causing your pain. While you are awaiting your ultrasound, if you develop any pain that will not go away, vomiting, fever, chills, chest pain, or shortness of breath please seek care immediately at the ED.   Abdominal Pain, Adult Many things can cause belly (abdominal) pain. Most times, belly pain is not dangerous. Many cases of belly pain can be watched and treated at home. Sometimes belly pain is serious, though. Your doctor will try to find the cause of your belly pain. Follow these instructions at home:  Take over-the-counter and prescription medicines only as told by your doctor. Do not take medicines that help you poop (laxatives) unless told to by your doctor.  Drink enough fluid to keep your pee (urine) clear or pale yellow.  Watch your belly pain for any changes.  Keep all follow-up visits as told by your doctor. This is important. Contact a doctor if:  Your belly pain changes or gets worse.  You are not hungry, or you lose weight without trying.  You are having trouble pooping (constipated) or have watery poop (diarrhea) for more than 2-3 days.  You have pain when you pee or poop.  Your belly pain wakes you up at night.  Your pain gets worse with meals, after eating, or with certain foods.  You are throwing up and cannot keep anything down.  You have a fever. Get help right away if:  Your pain does not go away as soon as your doctor says it should.  You cannot stop throwing up.  Your pain is only in areas of your belly, such as the right side or the left lower part of the belly.  You have bloody or black poop, or poop that  looks like tar.  You have very bad pain, cramping, or bloating in your belly.  You have signs of not having enough fluid or water in your body (dehydration), such as: ? Dark pee, very little pee, or no pee. ? Cracked lips. ? Dry mouth. ? Sunken eyes. ? Sleepiness. ? Weakness. This information is not intended to replace advice given to you by your health care provider. Make sure you discuss any questions you have with your health care provider. Document Released: 06/14/2007 Document Revised: 07/16/2015 Document Reviewed: 06/09/2015 Elsevier Interactive Patient Education  2017 [employer].   IF you received an x-ray today, you will receive an invoice from [facility]. Please contact [facility] at [phone] with questions or concerns regarding your invoice.   IF you received labwork today, you will receive an invoice from [facility]. Please contact [facility] at [phone] with questions or concerns regarding your invoice.   Our billing staff will not be able to assist you with questions regarding bills from these companies.  You will be contacted with the lab results as soon as they are available. The fastest way to get your results is to activate your My Chart account. Instructions are located on the last page of this paperwork. If you have not heard from [place] regarding the results in 2 weeks, please contact this office.

## 2016-10-19 ENCOUNTER — Ambulatory Visit (HOSPITAL_COMMUNITY)
Admission: RE | Admit: 2016-10-19 | Discharge: 2016-10-19 | Disposition: A | Discharge status: Home / Self Care | Payer: BLUE CROSS/BLUE SHIELD | Source: Ambulatory Visit | Attending: Physician Assistant | Admitting: Physician Assistant

## 2016-10-19 DIAGNOSIS — R1011 Right upper quadrant pain: Secondary | ICD-10-CM | POA: Diagnosis not present

## 2016-10-19 DIAGNOSIS — Z9049 Acquired absence of other specified parts of digestive tract: Secondary | ICD-10-CM | POA: Diagnosis not present

## 2016-10-19 DIAGNOSIS — R1013 Epigastric pain: Secondary | ICD-10-CM | POA: Diagnosis not present

## 2016-10-19 LAB — CMP14+EGFR
ALBUMIN: 4.2 g/dL (ref 3.5–5.5)
ALT: 9 IU/L (ref 0–32)
AST: 15 IU/L (ref 0–40)
Albumin/Globulin Ratio: 1.4 (ref 1.2–2.2)
Alkaline Phosphatase: 105 IU/L (ref 39–117)
BUN / CREAT RATIO: 13 (ref 9–23)
BUN: 9 mg/dL (ref 6–24)
Bilirubin Total: 0.8 mg/dL (ref 0.0–1.2)
CALCIUM: 9.6 mg/dL (ref 8.7–10.2)
CO2: 24 mmol/L (ref 20–29)
CREATININE: 0.67 mg/dL (ref 0.57–1.00)
Chloride: 104 mmol/L (ref 96–106)
GFR, EST AFRICAN AMERICAN: 123 mL/min/{1.73_m2} (ref >59)
GFR, EST NON AFRICAN AMERICAN: 106 mL/min/{1.73_m2} (ref >59)
GLOBULIN, TOTAL: 3.1 g/dL (ref 1.5–4.5)
GLUCOSE: 88 mg/dL (ref 65–99)
Potassium: 3.8 mmol/L (ref 3.5–5.2)
SODIUM: 142 mmol/L (ref 134–144)
TOTAL PROTEIN: 7.3 g/dL (ref 6.0–8.5)

## 2016-10-19 LAB — CBC WITH DIFFERENTIAL/PLATELET
BASOS ABS: 0.1 10*3/uL (ref 0.0–0.2)
Basos: 1 %
EOS (ABSOLUTE): 0.1 10*3/uL (ref 0.0–0.4)
Eos: 2 %
HEMOGLOBIN: 11.5 g/dL (ref 11.1–15.9)
Hematocrit: 32.8 % — ABNORMAL LOW (ref 34.0–46.6)
IMMATURE GRANS (ABS): 0 10*3/uL (ref 0.0–0.1)
IMMATURE GRANULOCYTES: 0 %
LYMPHS: 46 %
Lymphocytes Absolute: 2.8 10*3/uL (ref 0.7–3.1)
MCH: 29.6 pg (ref 26.6–33.0)
MCHC: 35.1 g/dL (ref 31.5–35.7)
MCV: 85 fL (ref 79–97)
MONOCYTES: 8 %
Monocytes Absolute: 0.5 10*3/uL (ref 0.1–0.9)
NEUTROS ABS: 2.6 10*3/uL (ref 1.4–7.0)
NEUTROS PCT: 43 %
PLATELETS: 251 10*3/uL (ref 150–379)
RBC: 3.88 x10E6/uL (ref 3.77–5.28)
RDW: 13.8 % (ref 12.3–15.4)
WBC: 6.1 10*3/uL (ref 3.4–10.8)

## 2016-10-19 LAB — LIPASE: Lipase: 40 U/L (ref 14–72)

## 2016-10-19 LAB — AMYLASE: AMYLASE: 63 U/L (ref 31–124)

## 2016-10-20 ENCOUNTER — Other Ambulatory Visit: Payer: Self-pay | Admitting: Physician Assistant

## 2016-10-20 ENCOUNTER — Encounter: Payer: Self-pay | Admitting: Physician Assistant

## 2016-10-20 MED ORDER — OMEPRAZOLE 20 MG PO CPDR: 20.0000 mg | DELAYED_RELEASE_CAPSULE | Freq: Every day | ORAL | 0 refills | Status: DC

## 2016-10-20 NOTE — Progress Notes (Signed)
Pt called and informed that she has in fact had a cholecystectomy, which is reassuring that her pain is not coming from a gallbladder. I have updated her surgical hx in EPIC to show this. Her labs showed normal white count, pancreatic enzymes,  electrolytes, blood sugar, liver enzymes. Her hx is suspicious for GERD vs PUD. Will attempt trial of PPIs, which has been electronically sent to her pharmacy. Instructed to avoid NSAID use at this time. Return if symptoms persist despite tx or sooner if sx worsen or she develops new concerning sx. Pt understands and agrees to tx plan.

## 2017-07-30 IMAGING — CT CT ABD-PELV W/ CM
2 of 4 series · 10 of 36 positions shown, 17 images · IV contrast ([ID] [id number])
Comparison: 05/30/2016, 05/29/2016, 05/22/2016

CLINICAL DATA: Perforated appendicitis, status post right lower
quadrant and right trans gluteal pelvic percutaneous drains for 2
abscesses

EXAM:
CT ABDOMEN AND PELVIS WITH CONTRAST
TECHNIQUE: Multidetector CT imaging of the abdomen and pelvis was performed
using the standard protocol following bolus administration of
intravenous contrast.
CONTRAST:  100mL [id number] IOPAMIDOL ([id number]) INJECTION 61%

[Series 3: abd/pelvis with · axial · 0.70mm/px · z∈[-329,+26]mm · 9 of 88 slices shown, 15 images]
[im 9/88  soft-tissue]
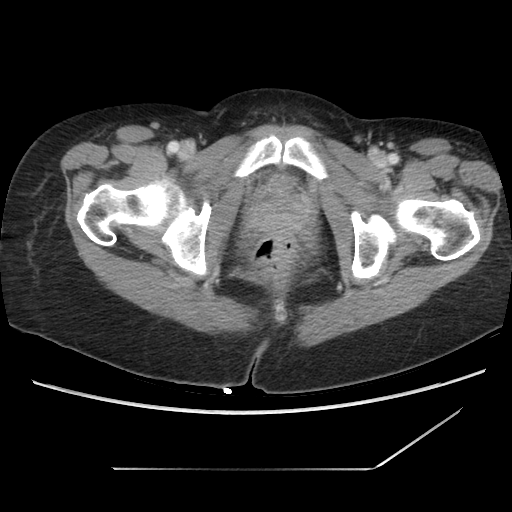
[im 9/88  bone]
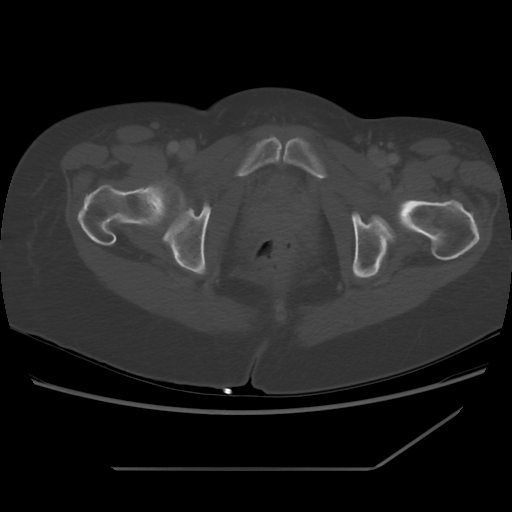
[im 18/88  soft-tissue]
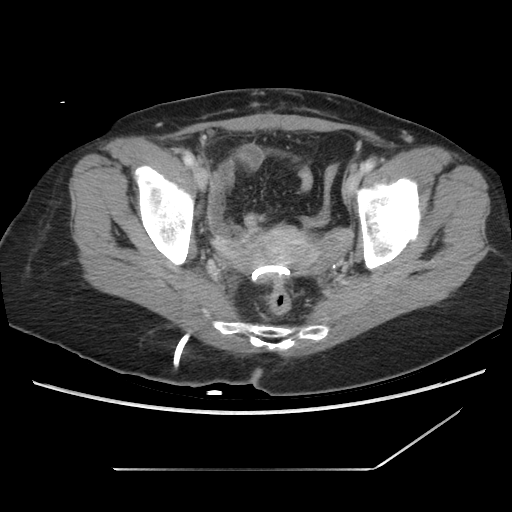
[im 27/88  soft-tissue]
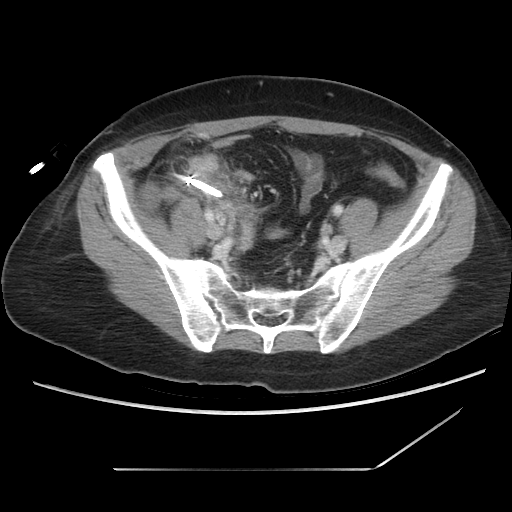
[im 35/88  soft-tissue]
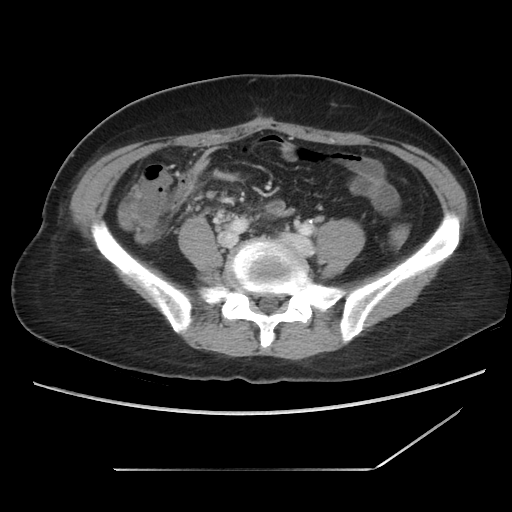
[im 44/88  soft-tissue]
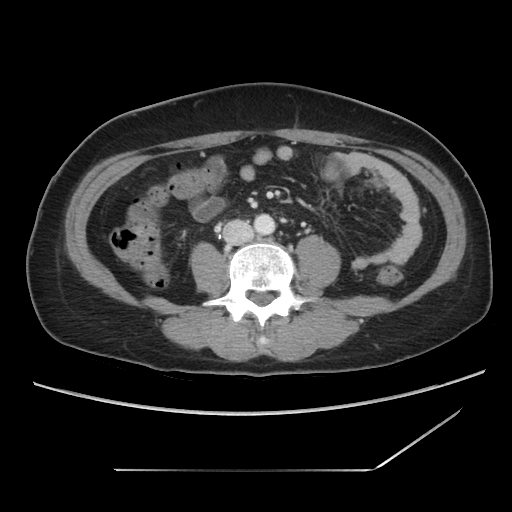
[im 53/88  soft-tissue]
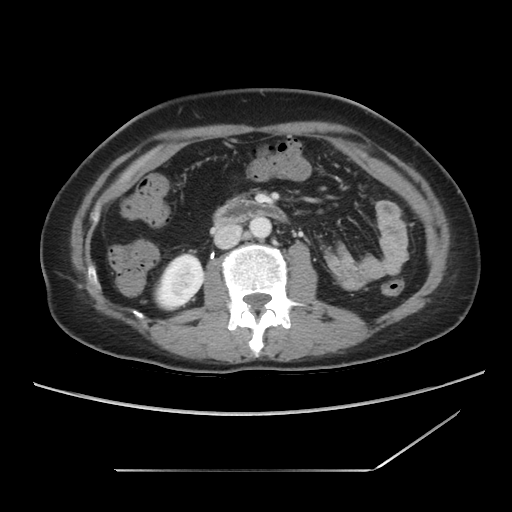
[im 53/88  lung]
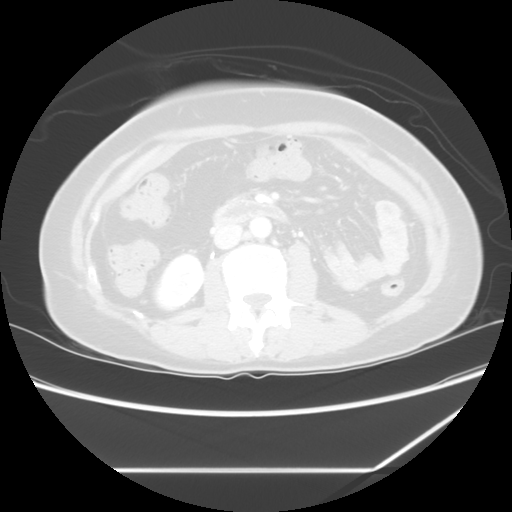
[im 61/88  soft-tissue]
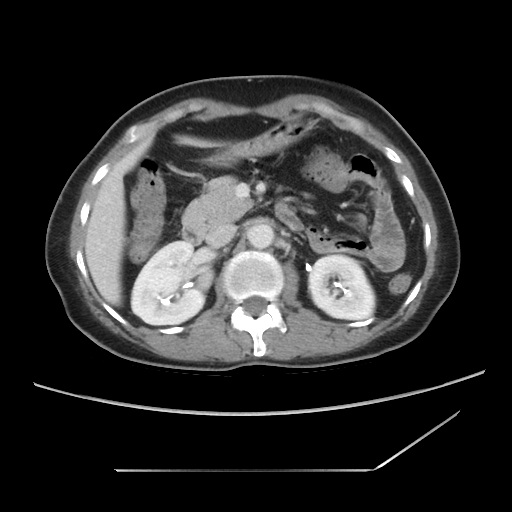
[im 61/88  lung]
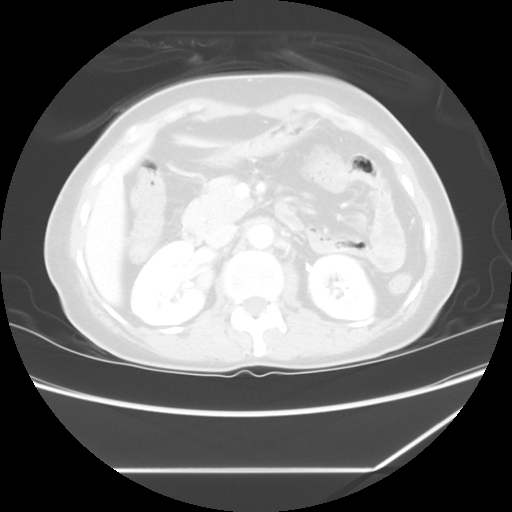
[im 70/88  soft-tissue]
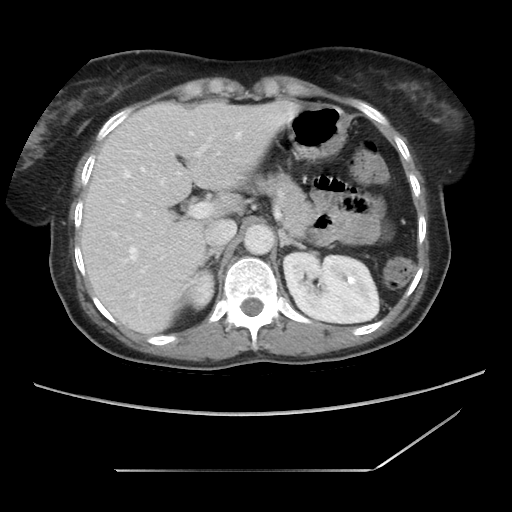
[im 70/88  lung]
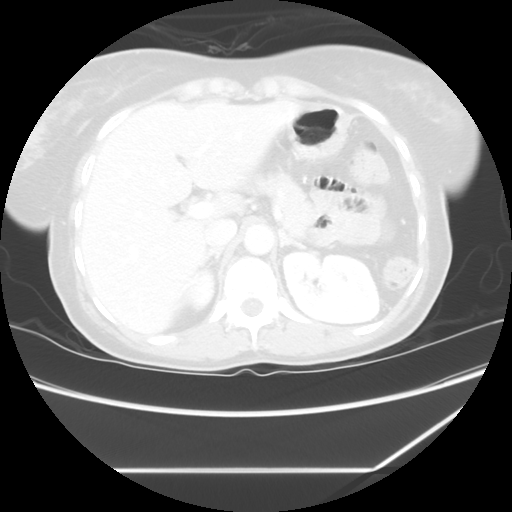
[im 79/88  soft-tissue]
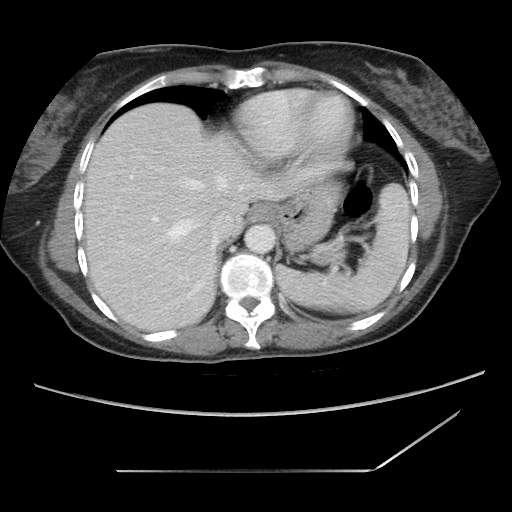
[im 79/88  lung]
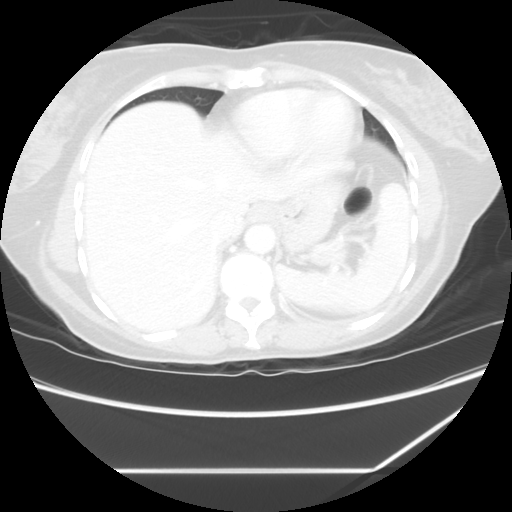
[im 79/88  bone]
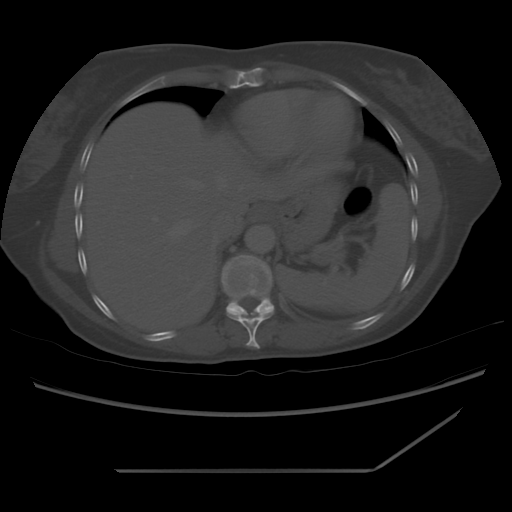

[Series 601: coronal body · coronal · 0.90mm/px · 1 of 103 slices shown, 2 images]
[im 35/103  soft-tissue]
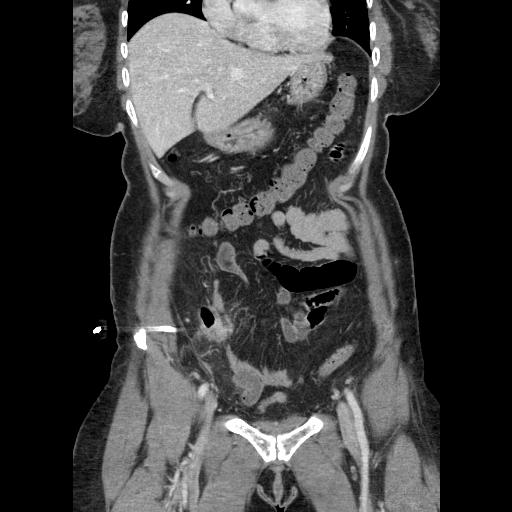
[im 35/103  bone]
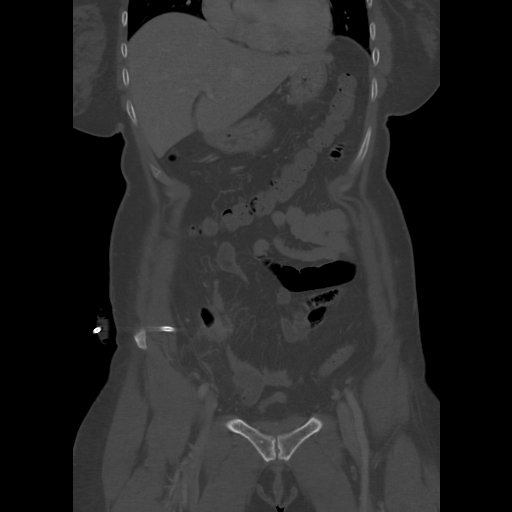

[10 of 36 positions shown; findings below may reference images not displayed]

FINDINGS: Lower chest: Minimal dependent basilar atelectasis. No lower lobe
pneumonia. No pericardial or pleural effusion. Normal heart size.

Hepatobiliary: Previous cholecystectomy. No focal hepatic
abnormality or biliary dilatation. Focal fatty infiltration of the
liver along the falciform ligament, image 23. Hepatic and portal
veins are patent.

Pancreas: Unremarkable. No pancreatic ductal dilatation or
surrounding inflammatory changes.

Spleen: Normal in size without focal abnormality.

Adrenals/Urinary Tract: Adrenal glands are unremarkable. Kidneys are
normal, without renal calculi, focal lesion, or hydronephrosis.
Bladder is unremarkable.

Stomach/Bowel: Negative for bowel obstruction, significant
dilatation, ileus, or free air. Right lower quadrant percutaneous
drain is stable in position at the previous abscess site from the
perforated appendicitis. Trace amount of residual fluid and air at
the drain measures 2.8 cm, image 59.

Right trans gluteal pelvic drain is also stable in position in the
midline pelvis. Previous pelvic collection has resolved.

No new abscess.

Vascular/Lymphatic: No significant vascular findings are present. No
enlarged abdominal or pelvic lymph nodes.

Reproductive: Uterus and bilateral adnexa are unremarkable.

Other: No abdominal wall hernia or abnormality. No abdominopelvic
ascites.

Musculoskeletal: No acute osseous finding
IMPRESSION: Resolved posterior pelvic abscess following right trans gluteal
percutaneous drain.

Small residual abscess at the right lower quadrant percutaneous
drain measuring 2.8 cm.

Residual right lower quadrant and right pelvic sidewall inflammation
related to the perforated appendicitis.

No new fluid collection

## 2017-10-09 ENCOUNTER — Encounter: Payer: Self-pay | Admitting: Emergency Medicine

## 2017-10-09 ENCOUNTER — Other Ambulatory Visit: Payer: Self-pay

## 2017-10-09 ENCOUNTER — Ambulatory Visit: Payer: BLUE CROSS/BLUE SHIELD | Admitting: Emergency Medicine

## 2017-10-09 VITALS — BP 129/73 | HR 76 | Temp 98.9°F | Resp 16 | Ht 67.0 in | Wt 161.2 lb

## 2017-10-09 DIAGNOSIS — L299 Pruritus, unspecified: Secondary | ICD-10-CM

## 2017-10-09 DIAGNOSIS — L989 Disorder of the skin and subcutaneous tissue, unspecified: Secondary | ICD-10-CM

## 2017-10-09 MED ORDER — TRIAMCINOLONE ACETONIDE 0.1 % EX CREA
1.0000 | TOPICAL_CREAM | Freq: Two times a day (BID) | CUTANEOUS | 0 refills | Status: DC
Start: 2017-10-09 — End: 2020-07-09

## 2017-10-09 NOTE — Patient Instructions (Addendum)
   If you have lab work done today you will be contacted with your lab results within the next 2 weeks.  If you have not heard from us then please contact us. The fastest way to get your results is to register for My Chart.   IF you received an x-ray today, you will receive an invoice from [facility]. Please contact [facility] at [phone] with questions or concerns regarding your invoice.   IF you received labwork today, you will receive an invoice from [facility]. Please contact [facility] at [phone] with questions or concerns regarding your invoice.   Our billing staff will not be able to assist you with questions regarding bills from these companies.  You will be contacted with the lab results as soon as they are available. The fastest way to get your results is to activate your My Chart account. Instructions are located on the last page of this paperwork. If you have not heard from us regarding the results in 2 weeks, please contact this office.    Pruritus Pruritus is an itching feeling. There are many different conditions and factors that can make your skin itchy. Dry skin is one of the most common causes of itching. Most cases of itching do not require medical attention. Itchy skin can turn into a rash. Follow these instructions at home: Watch your pruritus for any changes. Take these steps to help with your condition: Skin Care  Moisturize your skin as needed. A moisturizer that contains petroleum jelly is best for keeping moisture in your skin.  Take or apply medicines only as directed by your health care provider. This may include: ? Corticosteroid cream. ? Anti-itch lotions. ? Oral anti-histamines.  Apply cool compresses to the affected areas.  Try taking a bath with: ? Epsom salts. Follow the instructions on the packaging. You can get these at your local pharmacy or grocery store. ? Baking soda. Pour a small amount into the bath as directed by  your health care provider. ? Colloidal oatmeal. Follow the instructions on the packaging. You can get this at your local pharmacy or grocery store.  Try applying baking soda paste to your skin. Stir water into baking soda until it reaches a paste-like consistency.  Do not scratch your skin.  Avoid hot showers or baths, which can make itching worse. A cold shower may help with itching as long as you use a moisturizer after.  Avoid scented soaps, detergents, and perfumes. Use gentle soaps, detergents, perfumes, and other cosmetic products. General instructions  Avoid wearing tight clothes.  Keep a journal to help track what causes your itch. Write down: ? What you eat. ? What cosmetic products you use. ? What you drink. ? What you wear. This includes jewelry.  Use a humidifier. This keeps the air moist, which helps to prevent dry skin. Contact a health care provider if:  The itching does not go away after several days.  You sweat at night.  You have weight loss.  You are unusually thirsty.  You urinate more than normal.  You are more tired than normal.  You have abdominal pain.  Your skin tingles.  You feel weak.  Your skin or the whites of your eyes look yellow (jaundice).  Your skin feels numb. This information is not intended to replace advice given to you by your health care provider. Make sure you discuss any questions you have with your health care provider. Document Released: 09/07/2010 Document Revised: 06/03/2015 Document Reviewed:   12/22/2013 Elsevier Interactive Patient Education  2018 Elsevier Inc.  

## 2017-10-09 NOTE — Progress Notes (Signed)
Glenda Wilcox 47 y.o.   Chief Complaint  Patient presents with  . Rash    with itching - per patient around spot on RIGHT leg started yesterday    HISTORY OF PRESENT ILLNESS: This is a 47 y.o. female complaining of itching to chronic lesion on her right lower leg.  States she has had this lesion since she was a little.  HPI   Prior to Admission medications   Medication Sig Start Date End Date Taking? Authorizing Provider  omeprazole (PRILOSEC) 20 MG capsule Take 1 capsule (20 mg total) by mouth daily. 10/20/16  Yes Benjiman Core D, PA-C  acetaminophen (TYLENOL) 325 MG tablet Take 650 mg by mouth every 6 (six) hours as needed for mild pain.    [provider]    No Known Allergies  Patient Active Problem List   Diagnosis Date Noted  . Right lower quadrant abdominal pain 05/22/2016  . Acute appendicitis with peritoneal abscess s/p perc drainage 05/23/2016 05/22/2016    No past medical history on file.  Past Surgical History:  Procedure Laterality Date  . abscess surgery    . CHOLECYSTECTOMY    . gallstone removal    . IR RADIOLOGIST EVAL & MGMT  06/07/2016  . IR RADIOLOGIST EVAL & MGMT  06/20/2016    Social History   Socioeconomic History  . Marital status: Married    Spouse name: Not on file  . Number of children: Not on file  . Years of education: Not on file  . Highest education level: Not on file  Occupational History  . Not on file  Social Needs  . Financial resource strain: Not on file  . Food insecurity:    Worry: Not on file    Inability: Not on file  . Transportation needs:    Medical: Not on file    Non-medical: Not on file  Tobacco Use  . Smoking status: Never Smoker  . Smokeless tobacco: Never Used  Substance and Sexual Activity  . Alcohol use: No  . Drug use: No  . Sexual activity: Not on file  Lifestyle  . Physical activity:    Days per week: Not on file    Minutes per session: Not on file  . Stress: Not on file    Relationships  . Social connections:    Talks on phone: Not on file    Gets together: Not on file    Attends religious service: Not on file    Active member of club or organization: Not on file    Attends meetings of clubs or organizations: Not on file    Relationship status: Not on file  . Intimate partner violence:    Fear of current or ex partner: Not on file    Emotionally abused: Not on file    Physically abused: Not on file    Forced sexual activity: Not on file  Other Topics Concern  . Not on file  Social History Narrative   Originally from [place]    No family history on file.   Review of Systems  Constitutional: Negative.  Negative for chills and fever.  Respiratory: Negative for shortness of breath.   Gastrointestinal: Negative for nausea and vomiting.  Musculoskeletal: Negative for joint pain and myalgias.  Skin: Positive for itching and rash.  Neurological: Negative for dizziness and headaches.  All other systems reviewed and are negative.   Vitals:   10/09/17 1201  BP: 129/73  Pulse: 76  Resp: 16  Temp: 98.9 F (37.2 C)  SpO2: 100%    Physical Exam  Constitutional: She is oriented to person, place, and time. She appears well-developed and well-nourished.  HENT:  Head: Normocephalic and atraumatic.  Eyes: Pupils are equal, round, and reactive to light. EOM are normal.  Neck: Normal range of motion.  Cardiovascular: Normal rate.  Pulmonary/Chest: Effort normal.  Musculoskeletal: Normal range of motion.  Neurological: She is alert and oriented to person, place, and time.  Skin: Skin is warm and dry. Capillary refill takes less than 2 seconds.  Psychiatric: She has a normal mood and affect. Her behavior is normal.  Vitals reviewed.        ASSESSMENT & PLAN: [name] was seen today for rash.  Diagnoses and all orders for this visit:  Skin lesion -     Ambulatory referral to Dermatology  Itching -     triamcinolone cream (KENALOG) 0.1  %; Apply 1 application topically 2 (two) times daily.   Patient Instructions       If you have lab work done today you will be contacted with your lab results within the next 2 weeks.  If you have not heard from [place] then please contact us. The fastest way to get your results is to register for My Chart.   IF you received an x-ray today, you will receive an invoice from [facility]. Please contact [facility] at [phone] with questions or concerns regarding your invoice.   IF you received labwork today, you will receive an invoice from [facility]. Please contact [facility] at [phone] with questions or concerns regarding your invoice.   Our billing staff will not be able to assist you with questions regarding bills from these companies.  You will be contacted with the lab results as soon as they are available. The fastest way to get your results is to activate your My Chart account. Instructions are located on the last page of this paperwork. If you have not heard from [place] regarding the results in 2 weeks, please contact this office.    Pruritus Pruritus is an itching feeling. There are many different conditions and factors that can make your skin itchy. Dry skin is one of the most common causes of itching. Most cases of itching do not require medical attention. Itchy skin can turn into a rash. Follow these instructions at home: Watch your pruritus for any changes. Take these steps to help with your condition: Skin Care  Moisturize your skin as needed. A moisturizer that contains petroleum jelly is best for keeping moisture in your skin.  Take or apply medicines only as directed by your health care provider. This may include: ? Corticosteroid cream. ? Anti-itch lotions. ? Oral anti-histamines.  Apply cool compresses to the affected areas.  Try taking a bath with: ? Epsom salts. Follow the instructions on the packaging. You can get these at your local pharmacy  or grocery store. ? Baking soda. Pour a small amount into the bath as directed by your health care provider. ? Colloidal oatmeal. Follow the instructions on the packaging. You can get this at your local pharmacy or grocery store.  Try applying baking soda paste to your skin. Stir water into baking soda until it reaches a paste-like consistency.  Do not scratch your skin.  Avoid hot showers or baths, which can make itching worse. A cold shower may help with itching as long as you use a moisturizer after.  Avoid scented soaps, detergents, and perfumes. Use gentle  soaps, detergents, perfumes, and other cosmetic products. General instructions  Avoid wearing tight clothes.  Keep a journal to help track what causes your itch. Write down: ? What you eat. ? What cosmetic products you use. ? What you drink. ? What you wear. This includes jewelry.  Use a humidifier. This keeps the air moist, which helps to prevent dry skin. Contact a health care provider if:  The itching does not go away after several days.  You sweat at night.  You have weight loss.  You are unusually thirsty.  You urinate more than normal.  You are more tired than normal.  You have abdominal pain.  Your skin tingles.  You feel weak.  Your skin or the whites of your eyes look yellow (jaundice).  Your skin feels numb. This information is not intended to replace advice given to you by your health care provider. Make sure you discuss any questions you have with your health care provider. Document Released: 09/07/2010 Document Revised: 06/03/2015 Document Reviewed: 12/22/2013 Elsevier Interactive Patient Education  2018 [employer]vier Inc.      Edwina Barth, MD [facility]

## 2018-10-06 IMAGING — US US ABDOMEN LIMITED
1 series · 14 of 22 positions shown · non-contrast
Comparison: CT abdomen and pelvis 06/07/2016.

CLINICAL DATA: Right upper quadrant pain for 5 days with nausea.
The patient is status post cholecystectomy.

EXAM:
ULTRASOUND ABDOMEN LIMITED RIGHT UPPER QUADRANT

[Series 1: us abdomen limited · 0.20mm/px · 14 of 22 slices shown]
[im 1/22]
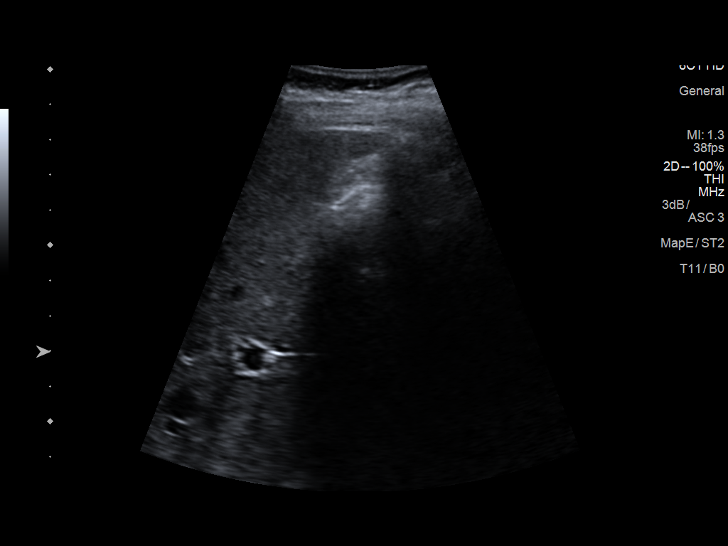
[im 3/22]
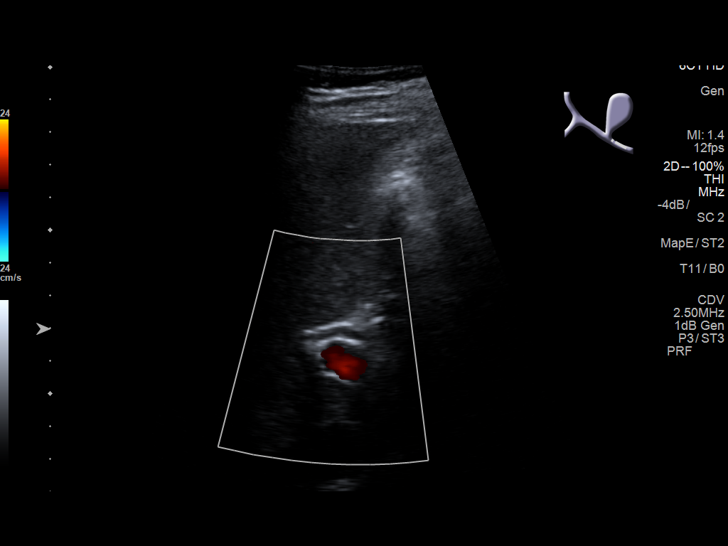
[im 4/22]
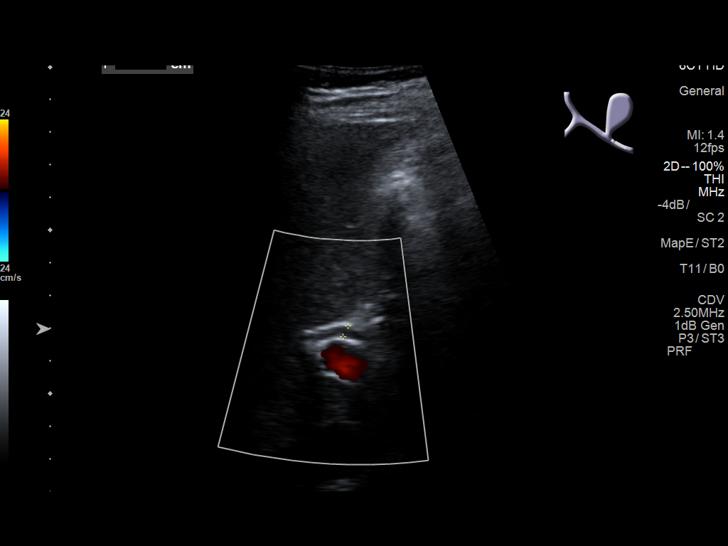
[im 6/22]
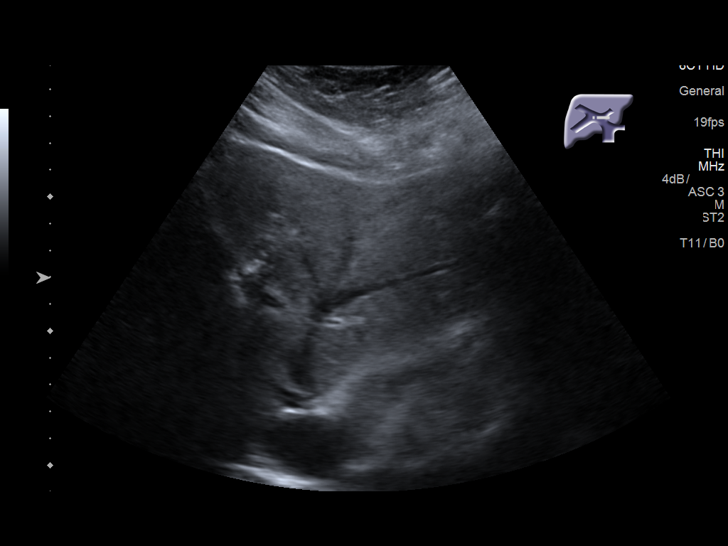
[im 8/22]
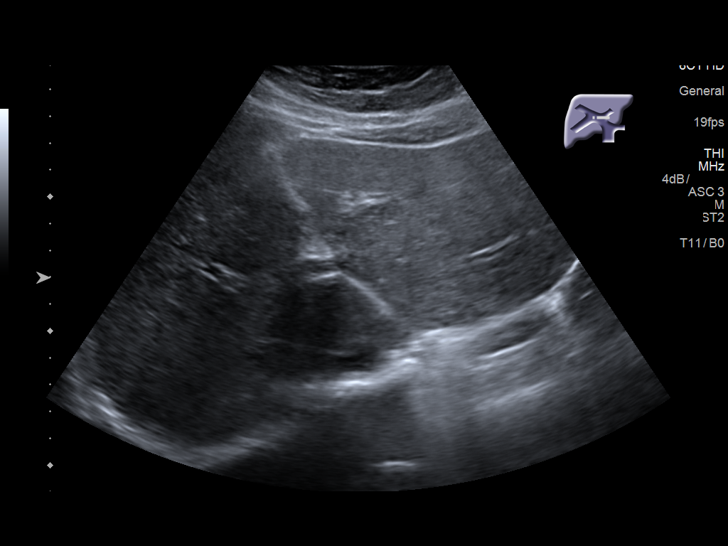
[im 9/22]
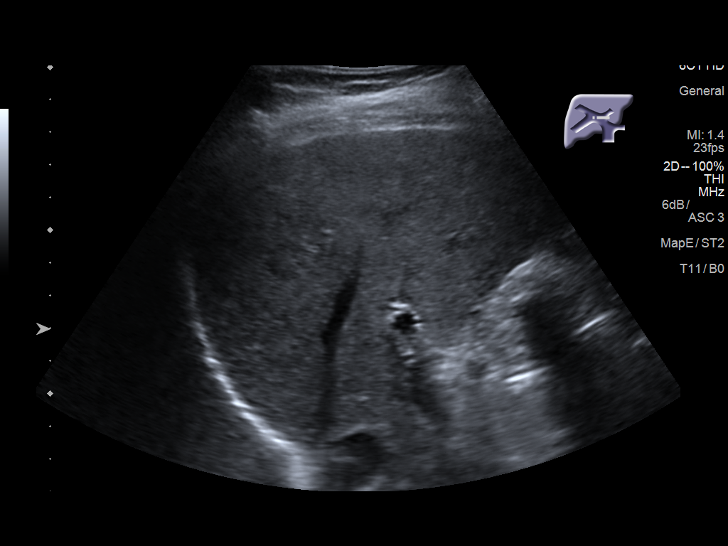
[im 11/22]
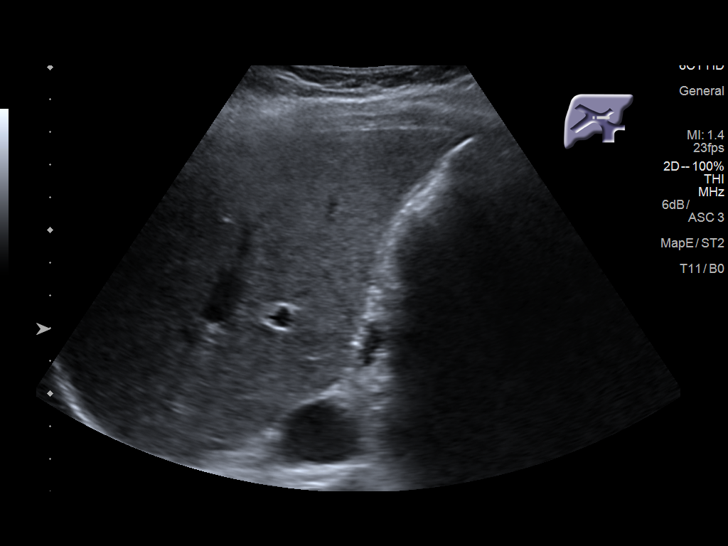
[im 12/22]
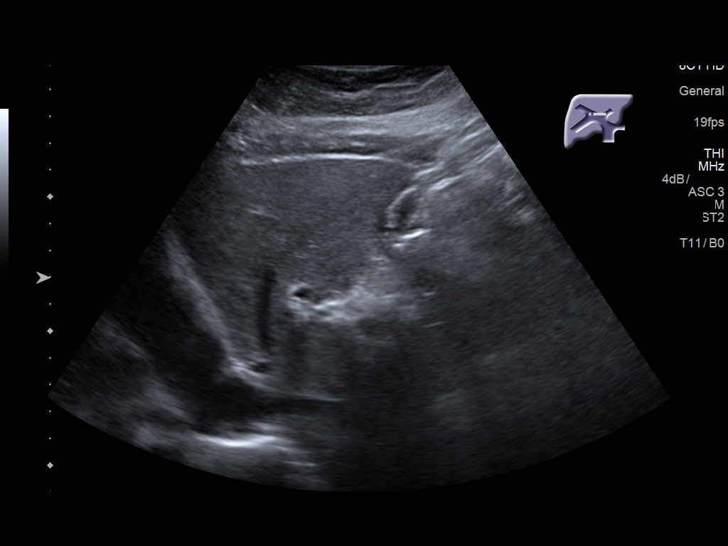
[im 14/22]
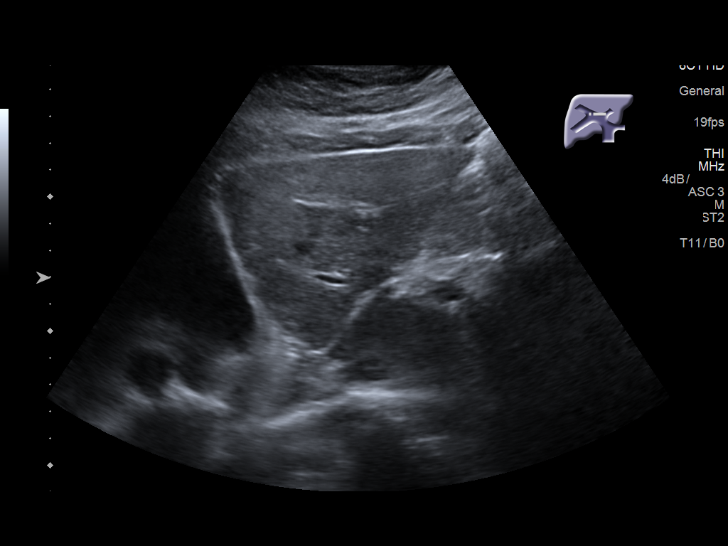
[im 15/22]
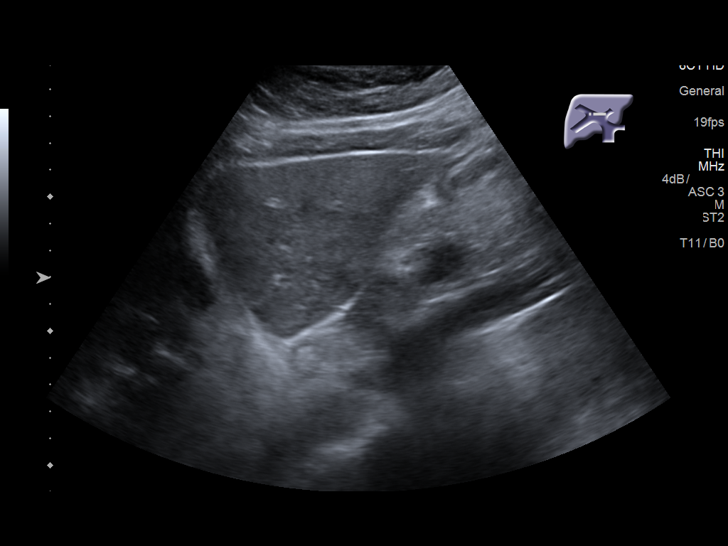
[im 17/22]
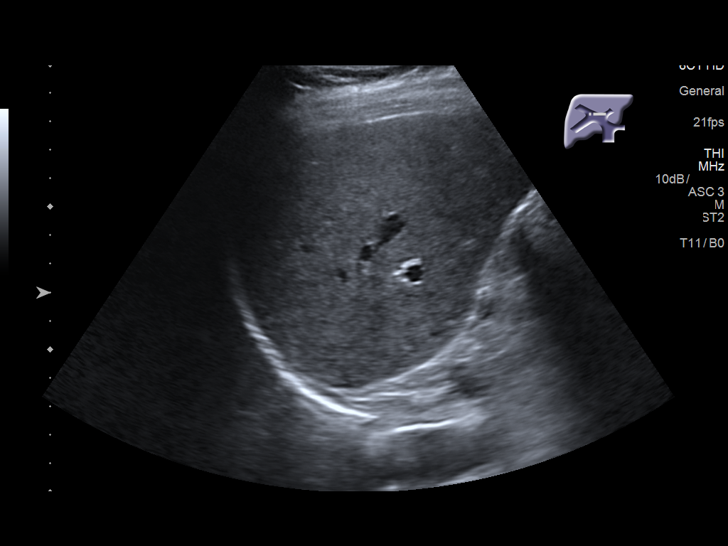
[im 19/22]
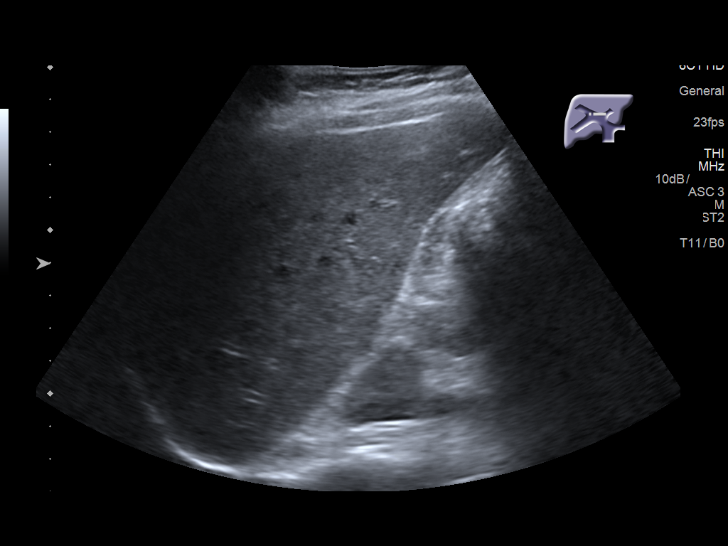
[im 20/22]
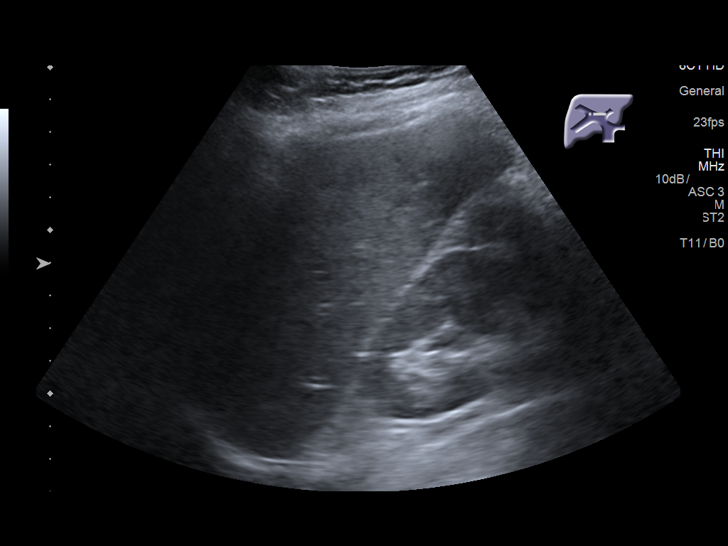
[im 22/22]
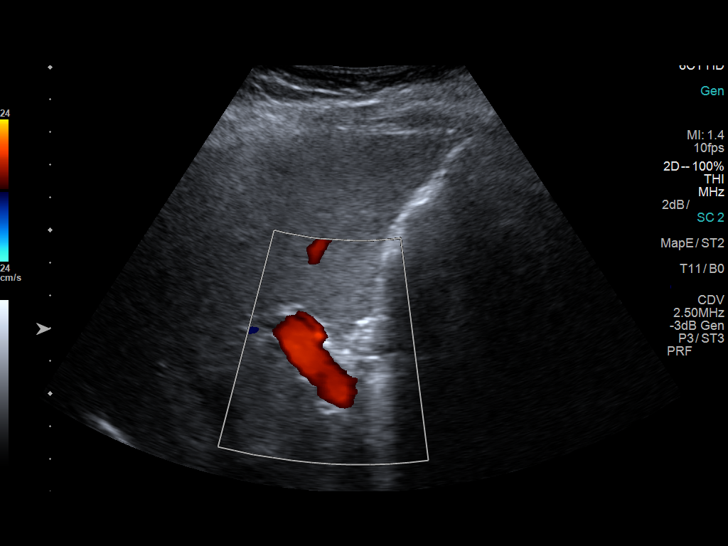

[14 of 22 positions shown; findings below may reference images not displayed]

FINDINGS: Gallbladder:

Removed.

Common bile duct:

Diameter: 0.4 cm.

Liver:

No focal lesion identified. Within normal limits in parenchymal
echogenicity. Portal vein is patent on color Doppler imaging with
normal direction of blood flow towards the liver.
IMPRESSION: Status post cholecystectomy.  The examination is otherwise negative.

## 2020-06-30 ENCOUNTER — Ambulatory Visit (HOSPITAL_BASED_OUTPATIENT_CLINIC_OR_DEPARTMENT_OTHER): Payer: BLUE CROSS/BLUE SHIELD | Admitting: Family Medicine

## 2020-07-09 ENCOUNTER — Ambulatory Visit (INDEPENDENT_AMBULATORY_CARE_PROVIDER_SITE_OTHER): Payer: Managed Care, Other (non HMO) | Admitting: Family Medicine

## 2020-07-09 ENCOUNTER — Other Ambulatory Visit: Payer: Self-pay

## 2020-07-09 ENCOUNTER — Encounter (HOSPITAL_BASED_OUTPATIENT_CLINIC_OR_DEPARTMENT_OTHER): Payer: Self-pay | Admitting: Family Medicine

## 2020-07-09 DIAGNOSIS — E663 Overweight: Secondary | ICD-10-CM | POA: Diagnosis not present

## 2020-07-09 DIAGNOSIS — D649 Anemia, unspecified: Secondary | ICD-10-CM

## 2020-07-09 DIAGNOSIS — R200 Anesthesia of skin: Secondary | ICD-10-CM

## 2020-07-09 NOTE — Assessment & Plan Note (Signed)
Check labs as below, discussed lifestyle modifications

## 2020-07-09 NOTE — Assessment & Plan Note (Signed)
Uncertain etiology.  Some components of history suggest ulnar tunnel syndrome, however exam not consistent with this We will continue to monitor symptoms over the next 4 weeks to assess for any change, progression, improvement If symptoms continue or are worsening, consider proceeding with nerve conduction study

## 2020-07-09 NOTE — Progress Notes (Signed)
New Patient Office Visit  Subjective:  Patient ID: Glenda Wilcox, female    DOB: [birth year]  Age: 50 y.o. MRN: [id number]  CC:  Chief Complaint  Patient presents with   Establish Care    Patient doesn't remember prior PCP   Arm Numbness    Patient presents today with complaints of left arm numbness x 1 month. Patient states she may have hit her elbow a few times but it unsure if it is related to numbness sensation. Patient states numbness is in the lower forearm from the elbow down into hands and fingertips. Sometimes the sensation travels upwards towards her shoulder.    HPI Glenda Wilcox is a 50 year old female presenting to establish in clinic.  She has current concerns today related to left arm numbness.  Denies significant past medical history.  Left arm numbness: Reports this initially began about 1 month ago.  Denies any associated pain.  Symptoms initially began in distal left upper extremity extending from below the elbow down to the hand.  Feels that she might have hit her elbow onto something which triggered initial symptoms.  Since then she feels that symptoms have progressed to include left arm from shoulder down to her hand.  Symptoms will be brought on or worsened with prolonged elbow flexion.  Reports that she will sometimes experience symptoms when she is driving and holding the steering wheel.  Denies any nighttime awakenings with the symptoms.  Indicates that initially it was her index and middle finger that were affected, now primarily the index finger.  Unsure if it has changed much since symptoms initially began.  Denies any neck pain.  History reviewed. No pertinent past medical history.  Past Surgical History:  Procedure Laterality Date   abscess surgery     CHOLECYSTECTOMY     gallstone removal     IR RADIOLOGIST EVAL & MGMT  06/07/2016   IR RADIOLOGIST EVAL & MGMT  06/20/2016    Family History  Problem Relation Age of Onset   Hypertension Mother    Diabetes  Mother     Social History   Socioeconomic History   Marital status: Married    Spouse name: Not on file   Number of children: Not on file   Years of education: Not on file   Highest education level: Not on file  Occupational History   Not on file  Tobacco Use   Smoking status: Never    Passive exposure: Never   Smokeless tobacco: Never  Vaping Use   Vaping Use: Never used  Substance and Sexual Activity   Alcohol use: No   Drug use: No   Sexual activity: Not Currently  Other Topics Concern   Not on file  Social History Narrative   Originally from [place]   Social Determinants of Health   Financial Resource Strain: Not on file  Food Insecurity: Not on file  Transportation Needs: Not on file  Physical Activity: Not on file  Stress: Not on file  Social Connections: Not on file  Intimate Partner Violence: Not on file    Objective:   Today's Vitals: BP 112/72   Pulse 79   Ht 5\' 8"  (1.727 m)   Wt 169 lb (76.7 kg)   LMP 09/08/2017   SpO2 99%   BMI 25.70 kg/m   Physical Exam  50 year old female in no acute distress Cardiovascular exam with regular rate and rhythm, no murmurs appreciated Lungs clear to auscultation bilaterally Left upper extremity with good range  of motion of the shoulder, elbow, wrist without pain with active or passive range of motion.  Slight decreased sensation to light touch over left index finger.  Negative Tinel's over carpal tunnel and ulnar tunnel.  Normal strength at elbow for flexion and extension, normal strength at wrist for flexion, extension, ulnar and radial deviation.  No wasting of intrinsic muscles of left hand.  Negative Spurling's.  Assessment & Plan:   Problem List Items Addressed This Visit       Other   Arm numbness    Uncertain etiology.  Some components of history suggest ulnar tunnel syndrome, however exam not consistent with this We will continue to monitor symptoms over the next 4 weeks to assess for any change,  progression, improvement If symptoms continue or are worsening, consider proceeding with nerve conduction study       Relevant Orders   Basic metabolic panel   Lipid panel   Overweight (BMI 25.0-29.9)    Check labs as below, discussed lifestyle modifications       Relevant Orders   Basic metabolic panel   CBC with Differential/Platelet   Lipid panel   Anemia    Noted in the past, has not had labs in about 4 years Will check labs as below       Relevant Orders   Basic metabolic panel   CBC with Differential/Platelet   Lipid panel    Outpatient Encounter Medications as of 07/09/2020  Medication Sig   [DISCONTINUED] acetaminophen (TYLENOL) 325 MG tablet Take 650 mg by mouth every 6 (six) hours as needed for mild pain.   [DISCONTINUED] omeprazole (PRILOSEC) 20 MG capsule Take 1 capsule (20 mg total) by mouth daily.   [DISCONTINUED] triamcinolone cream (KENALOG) 0.1 % Apply 1 application topically 2 (two) times daily.   No facility-administered encounter medications on file as of 07/09/2020.   Spent 45 minutes on this patient encounter, including preparation, chart review, face-to-face counseling with patient and coordination of care, and documentation of encounter  Follow-up: Return in about 4 weeks (around 08/06/2020) for Follow Up.  Assess progress of left arm numbness at next visit  Lillianne Eick J De [place], MD

## 2020-07-09 NOTE — Patient Instructions (Signed)
  Medication Instructions:  Your physician recommends that you continue on your current medications as directed. Please refer to the Current Medication list given to you today. --If you need a refill on any your medications before your next appointment, please call your pharmacy first. If no refills are authorized on file call the office.--  Lab Work: Your physician has recommended that you have lab work today: CBC, BMET, and Lipid Profile If you have labs (blood work) drawn today and your tests are completely normal, you will receive your results via MyChart message OR a phone call from our staff.  Please ensure you check your voicemail in the event that you authorized detailed messages to be left on a delegated number. If you have any lab test that is abnormal or we need to change your treatment, we will call you to review the results.  Follow-Up: Your next appointment:   Your physician recommends that you schedule a follow-up appointment in: 4 WEEKS with Dr. de [place]  Thanks for letting us be apart of your health journey!!  Primary Care and Sports Medicine   Dr. Ceasar Mons [place]   We encourage you to activate your patient portal called "MyChart".  Sign up information is provided on this After Visit Summary.  MyChart is used to connect with patients for Virtual Visits (Telemedicine).  Patients are able to view lab/test results, encounter notes, upcoming appointments, etc.  Non-urgent messages can be sent to your provider as well. To learn more about what you can do with MyChart, please visit --  [email or web address].

## 2020-07-09 NOTE — Assessment & Plan Note (Signed)
Noted in the past, has not had labs in about 4 years Will check labs as below

## 2020-07-10 LAB — CBC WITH DIFFERENTIAL/PLATELET
Basophils Absolute: 0.1 10*3/uL (ref 0.0–0.2)
Basos: 1 %
EOS (ABSOLUTE): 0.1 10*3/uL (ref 0.0–0.4)
Eos: 2 %
Hematocrit: 35.8 % (ref 34.0–46.6)
Hemoglobin: 12.5 g/dL (ref 11.1–15.9)
Immature Grans (Abs): 0 10*3/uL (ref 0.0–0.1)
Immature Granulocytes: 0 %
Lymphocytes Absolute: 1.8 10*3/uL (ref 0.7–3.1)
Lymphs: 34 %
MCH: 30.6 pg (ref 26.6–33.0)
MCHC: 34.9 g/dL (ref 31.5–35.7)
MCV: 88 fL (ref 79–97)
Monocytes Absolute: 0.5 10*3/uL (ref 0.1–0.9)
Monocytes: 10 %
Neutrophils Absolute: 2.8 10*3/uL (ref 1.4–7.0)
Neutrophils: 53 %
Platelets: 253 10*3/uL (ref 150–450)
RBC: 4.09 x10E6/uL (ref 3.77–5.28)
RDW: 13.4 % (ref 11.7–15.4)
WBC: 5.3 10*3/uL (ref 3.4–10.8)

## 2020-07-10 LAB — BASIC METABOLIC PANEL
BUN/Creatinine Ratio: 17 (ref 9–23)
BUN: 11 mg/dL (ref 6–24)
CO2: 24 mmol/L (ref 20–29)
Calcium: 9.7 mg/dL (ref 8.7–10.2)
Chloride: 102 mmol/L (ref 96–106)
Creatinine, Ser: 0.65 mg/dL (ref 0.57–1.00)
Glucose: 79 mg/dL (ref 65–99)
Potassium: 3.9 mmol/L (ref 3.5–5.2)
Sodium: 141 mmol/L (ref 134–144)
eGFR: 108 mL/min/{1.73_m2} (ref >59)

## 2020-07-10 LAB — LIPID PANEL
Chol/HDL Ratio: 4.1 ratio (ref 0.0–4.4)
Cholesterol, Total: 195 mg/dL (ref 100–199)
HDL: 47 mg/dL (ref >39)
LDL Chol Calc (NIH): 101 mg/dL — ABNORMAL HIGH (ref 0–99)
Triglycerides: 277 mg/dL — ABNORMAL HIGH (ref 0–149)
VLDL Cholesterol Cal: 47 mg/dL — ABNORMAL HIGH (ref 5–40)

## 2020-08-02 ENCOUNTER — Telehealth (HOSPITAL_BASED_OUTPATIENT_CLINIC_OR_DEPARTMENT_OTHER): Payer: Self-pay

## 2020-08-02 NOTE — Telephone Encounter (Signed)
-----   Message from Hosie Poisson [place], MD sent at 07/29/2020  4:37 PM EDT ----- Glenda Wilcox blood cell and red blood cell counts are normal with normal hemoglobin.  Electrolytes and kidney function are normal. Lipid panel with elevated triglycerides and elevated "bad" cholesterol.  Would focus on lifestyle modifications to address cholesterol issues.  This will include dietary changes - incorporating fresh fruits and vegetables, lean protein in the diet and reducing consumption of red meats, saturated fats, processed foods.  Recommend gradually increasing level of activity as discussed in the office.  Eventual goal should be started about 150 minutes/week of moderate intensity aerobic exercise.

## 2020-08-02 NOTE — Telephone Encounter (Signed)
Left message for patient to call back for results and recommendations. 

## 2020-08-06 ENCOUNTER — Other Ambulatory Visit: Payer: Self-pay

## 2020-08-06 ENCOUNTER — Encounter (HOSPITAL_BASED_OUTPATIENT_CLINIC_OR_DEPARTMENT_OTHER): Payer: Self-pay | Admitting: Family Medicine

## 2020-08-06 ENCOUNTER — Ambulatory Visit (INDEPENDENT_AMBULATORY_CARE_PROVIDER_SITE_OTHER): Payer: Managed Care, Other (non HMO) | Admitting: Family Medicine

## 2020-08-06 VITALS — BP 118/74 | HR 76 | Ht 68.0 in | Wt 169.8 lb

## 2020-08-06 DIAGNOSIS — E785 Hyperlipidemia, unspecified: Secondary | ICD-10-CM | POA: Diagnosis not present

## 2020-08-06 DIAGNOSIS — R200 Anesthesia of skin: Secondary | ICD-10-CM | POA: Diagnosis not present

## 2020-08-06 NOTE — Progress Notes (Signed)
    Procedures performed today:    None.  Independent interpretation of notes and tests performed by another provider:   None.  Brief History, Exam, Impression, and Recommendations:    BP 118/74   Pulse 76   Ht 5\' 8"  (1.727 m)   Wt 169 lb 12.8 oz (77 kg)   LMP 09/08/2017   SpO2 98%   BMI 25.82 kg/m   Arm numbness Reports that the numbness and tingling that she has had in her left forearm extending from elbow down to fingers has been improving since last visit Overall she feels that symptoms have improved by about 80% Still gets some symptoms daily, however the severity/intensity of the symptoms has notably decreased No significant nighttime awakenings On exam, Tinel's over cubital tunnel does produce some mild symptoms Suspect that underlying etiology is cubital tunnel syndrome Discussed management Given that she has had notable improvement, will continue to monitor, no further testing at this time Plan for follow-up in about 2 months to monitor progress  Dyslipidemia Discussed results of recent labs, calculated ASCVD risk score of 1.5% Notification for statin therapy at this time Discussed lifestyle modifications to help with cholesterol numbers Continue to monitor in the future    ___________________________________________ Glenda Wilcox de 09/10/2017, MD, ABFM, CAQSM Primary Care and Sports Medicine [facility]

## 2020-08-06 NOTE — Patient Instructions (Signed)
  Medication Instructions:  Your physician recommends that you continue on your current medications as directed. Please refer to the Current Medication list given to you today. --If you need a refill on any your medications before your next appointment, please call your pharmacy first. If no refills are authorized on file call the office.-- Follow-Up: Your next appointment:   Your physician recommends that you schedule a follow-up appointment in: 2 MONTHS with Dr. de [place]  Thanks for letting us be apart of your health journey!!  Primary Care and Sports Medicine   Dr. Raymond de [place]   We encourage you to activate your patient portal called "MyChart".  Sign up information is provided on this After Visit Summary.  MyChart is used to connect with patients for Virtual Visits (Telemedicine).  Patients are able to view lab/test results, encounter notes, upcoming appointments, etc.  Non-urgent messages can be sent to your provider as well. To learn more about what you can do with MyChart, please visit --  [email or web address].    

## 2020-08-06 NOTE — Assessment & Plan Note (Signed)
Reports that the numbness and tingling that she has had in her left forearm extending from elbow down to fingers has been improving since last visit Overall she feels that symptoms have improved by about 80% Still gets some symptoms daily, however the severity/intensity of the symptoms has notably decreased No significant nighttime awakenings On exam, Tinel's over cubital tunnel does produce some mild symptoms Suspect that underlying etiology is cubital tunnel syndrome Discussed management Given that she has had notable improvement, will continue to monitor, no further testing at this time Plan for follow-up in about 2 months to monitor progress

## 2020-08-06 NOTE — Assessment & Plan Note (Signed)
Discussed results of recent labs, calculated ASCVD risk score of 1.5% Notification for statin therapy at this time Discussed lifestyle modifications to help with cholesterol numbers Continue to monitor in the future

## 2020-10-07 ENCOUNTER — Ambulatory Visit (HOSPITAL_BASED_OUTPATIENT_CLINIC_OR_DEPARTMENT_OTHER): Payer: Managed Care, Other (non HMO) | Admitting: Family Medicine

## 2020-10-27 ENCOUNTER — Encounter (HOSPITAL_BASED_OUTPATIENT_CLINIC_OR_DEPARTMENT_OTHER): Payer: Self-pay | Admitting: Family Medicine
